# Patient Record
Sex: Male | Born: 1945 | Race: Black or African American | Hispanic: No | Marital: Married | Smoking: Former smoker
Health system: Southern US, Community
[De-identification: ages and names within clinical notes are randomized; demographics above are authoritative.]

## PROBLEM LIST (undated history)

## (undated) DIAGNOSIS — M1990 Unspecified osteoarthritis, unspecified site: Secondary | ICD-10-CM

## (undated) DIAGNOSIS — F40240 Claustrophobia: Secondary | ICD-10-CM

## (undated) DIAGNOSIS — K219 Gastro-esophageal reflux disease without esophagitis: Secondary | ICD-10-CM

## (undated) DIAGNOSIS — I10 Essential (primary) hypertension: Secondary | ICD-10-CM

## (undated) DIAGNOSIS — E119 Type 2 diabetes mellitus without complications: Secondary | ICD-10-CM

## (undated) DIAGNOSIS — C801 Malignant (primary) neoplasm, unspecified: Secondary | ICD-10-CM

## (undated) HISTORY — DX: Claustrophobia: F40.240

## (undated) HISTORY — PX: ROTATOR CUFF REPAIR: SHX139

## (undated) HISTORY — DX: Gastro-esophageal reflux disease without esophagitis: K21.9

## (undated) HISTORY — PX: COLONOSCOPY: SHX174

## (undated) HISTORY — PX: KNEE SURGERY: SHX244

## (undated) HISTORY — DX: Unspecified osteoarthritis, unspecified site: M19.90

## (undated) HISTORY — PX: POLYPECTOMY: SHX149

## (undated) HISTORY — PX: FINGER SURGERY: SHX640

---

## 1998-04-16 DIAGNOSIS — C189 Malignant neoplasm of colon, unspecified: Secondary | ICD-10-CM

## 1998-04-16 HISTORY — DX: Malignant neoplasm of colon, unspecified: C18.9

## 1999-02-10 ENCOUNTER — Encounter (INDEPENDENT_AMBULATORY_CARE_PROVIDER_SITE_OTHER): Payer: Self-pay | Admitting: *Deleted

## 1999-02-10 ENCOUNTER — Ambulatory Visit (HOSPITAL_COMMUNITY): Admission: RE | Admit: 1999-02-10 | Discharge: 1999-02-10 | Payer: Self-pay | Admitting: *Deleted

## 1999-02-15 HISTORY — PX: COLON SURGERY: SHX602

## 1999-02-22 ENCOUNTER — Ambulatory Visit (HOSPITAL_COMMUNITY): Admission: RE | Admit: 1999-02-22 | Discharge: 1999-02-22 | Payer: Self-pay | Admitting: General Surgery

## 1999-02-22 ENCOUNTER — Encounter (HOSPITAL_BASED_OUTPATIENT_CLINIC_OR_DEPARTMENT_OTHER): Payer: Self-pay | Admitting: General Surgery

## 1999-02-27 ENCOUNTER — Encounter (HOSPITAL_BASED_OUTPATIENT_CLINIC_OR_DEPARTMENT_OTHER): Payer: Self-pay | Admitting: General Surgery

## 1999-03-01 ENCOUNTER — Encounter (INDEPENDENT_AMBULATORY_CARE_PROVIDER_SITE_OTHER): Payer: Self-pay | Admitting: *Deleted

## 1999-03-01 ENCOUNTER — Inpatient Hospital Stay (HOSPITAL_COMMUNITY): Admission: RE | Admit: 1999-03-01 | Discharge: 1999-03-07 | Payer: Self-pay | Admitting: General Surgery

## 2001-02-27 ENCOUNTER — Encounter: Payer: Self-pay | Admitting: Emergency Medicine

## 2001-02-27 ENCOUNTER — Emergency Department (HOSPITAL_COMMUNITY): Admission: EM | Admit: 2001-02-27 | Discharge: 2001-02-27 | Payer: Self-pay | Admitting: Emergency Medicine

## 2001-02-27 ENCOUNTER — Encounter (INDEPENDENT_AMBULATORY_CARE_PROVIDER_SITE_OTHER): Payer: Self-pay | Admitting: *Deleted

## 2001-03-18 ENCOUNTER — Ambulatory Visit (HOSPITAL_BASED_OUTPATIENT_CLINIC_OR_DEPARTMENT_OTHER): Admission: RE | Admit: 2001-03-18 | Discharge: 2001-03-18 | Payer: Self-pay | Admitting: Urology

## 2003-05-04 ENCOUNTER — Ambulatory Visit (HOSPITAL_COMMUNITY): Admission: RE | Admit: 2003-05-04 | Discharge: 2003-05-04 | Payer: Self-pay | Admitting: Podiatry

## 2003-05-04 ENCOUNTER — Ambulatory Visit (HOSPITAL_BASED_OUTPATIENT_CLINIC_OR_DEPARTMENT_OTHER): Admission: RE | Admit: 2003-05-04 | Discharge: 2003-05-04 | Payer: Self-pay | Admitting: Podiatry

## 2004-04-24 ENCOUNTER — Ambulatory Visit (HOSPITAL_COMMUNITY): Admission: RE | Admit: 2004-04-24 | Discharge: 2004-04-24 | Payer: Self-pay | Admitting: Internal Medicine

## 2004-07-10 ENCOUNTER — Encounter: Admission: RE | Admit: 2004-07-10 | Discharge: 2004-07-10 | Payer: Self-pay | Admitting: Orthopedic Surgery

## 2006-12-31 ENCOUNTER — Emergency Department (HOSPITAL_COMMUNITY): Admission: EM | Admit: 2006-12-31 | Discharge: 2006-12-31 | Payer: Self-pay | Admitting: Emergency Medicine

## 2008-08-07 IMAGING — CR DG CHEST 2V
2 series · 2 of 2 positions shown · non-contrast
Comparison: none

CLINICAL DATA: Shortness of breath, left-sided chest numbness. 
 CHEST - 2 VIEW:

[w chest pa]
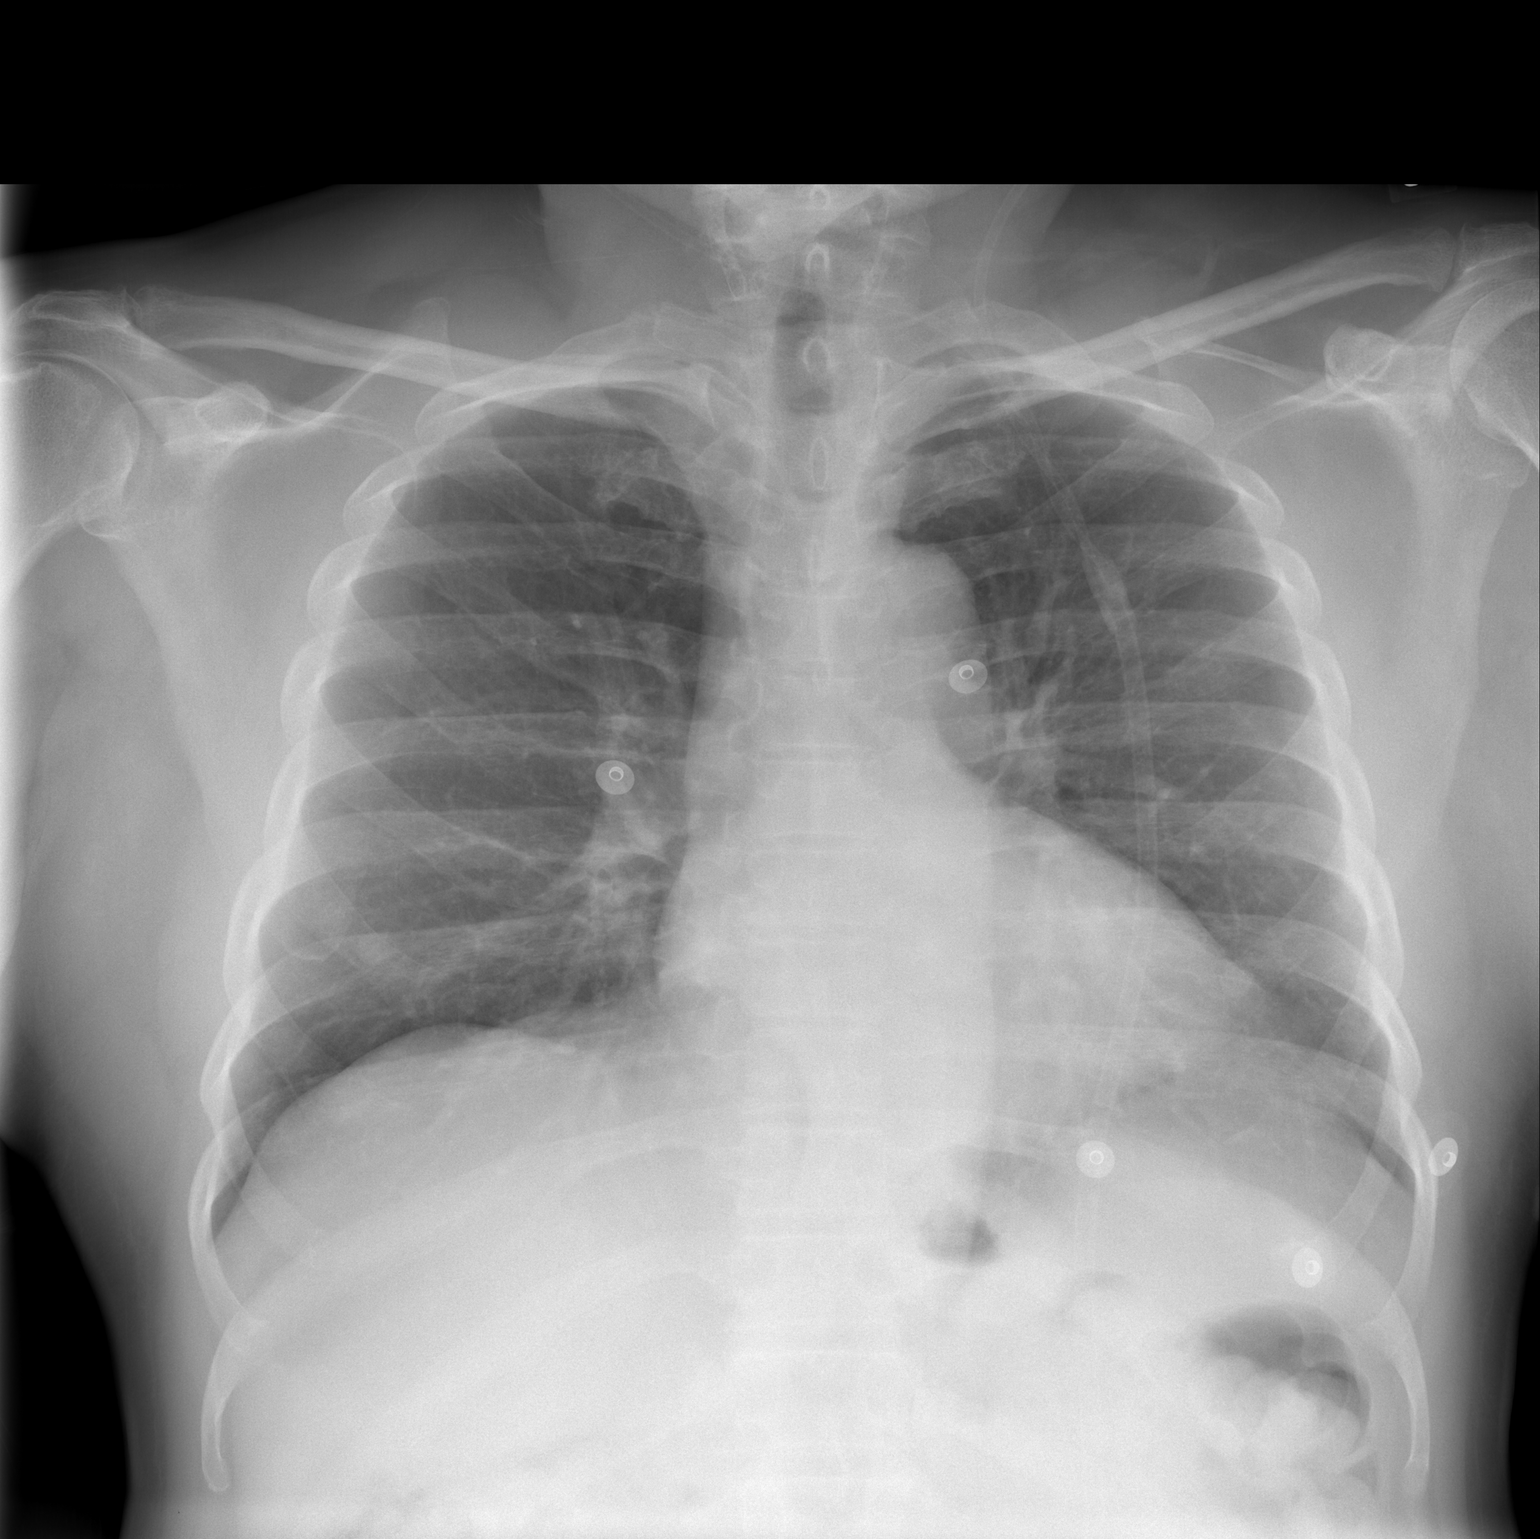

[w chest lat]
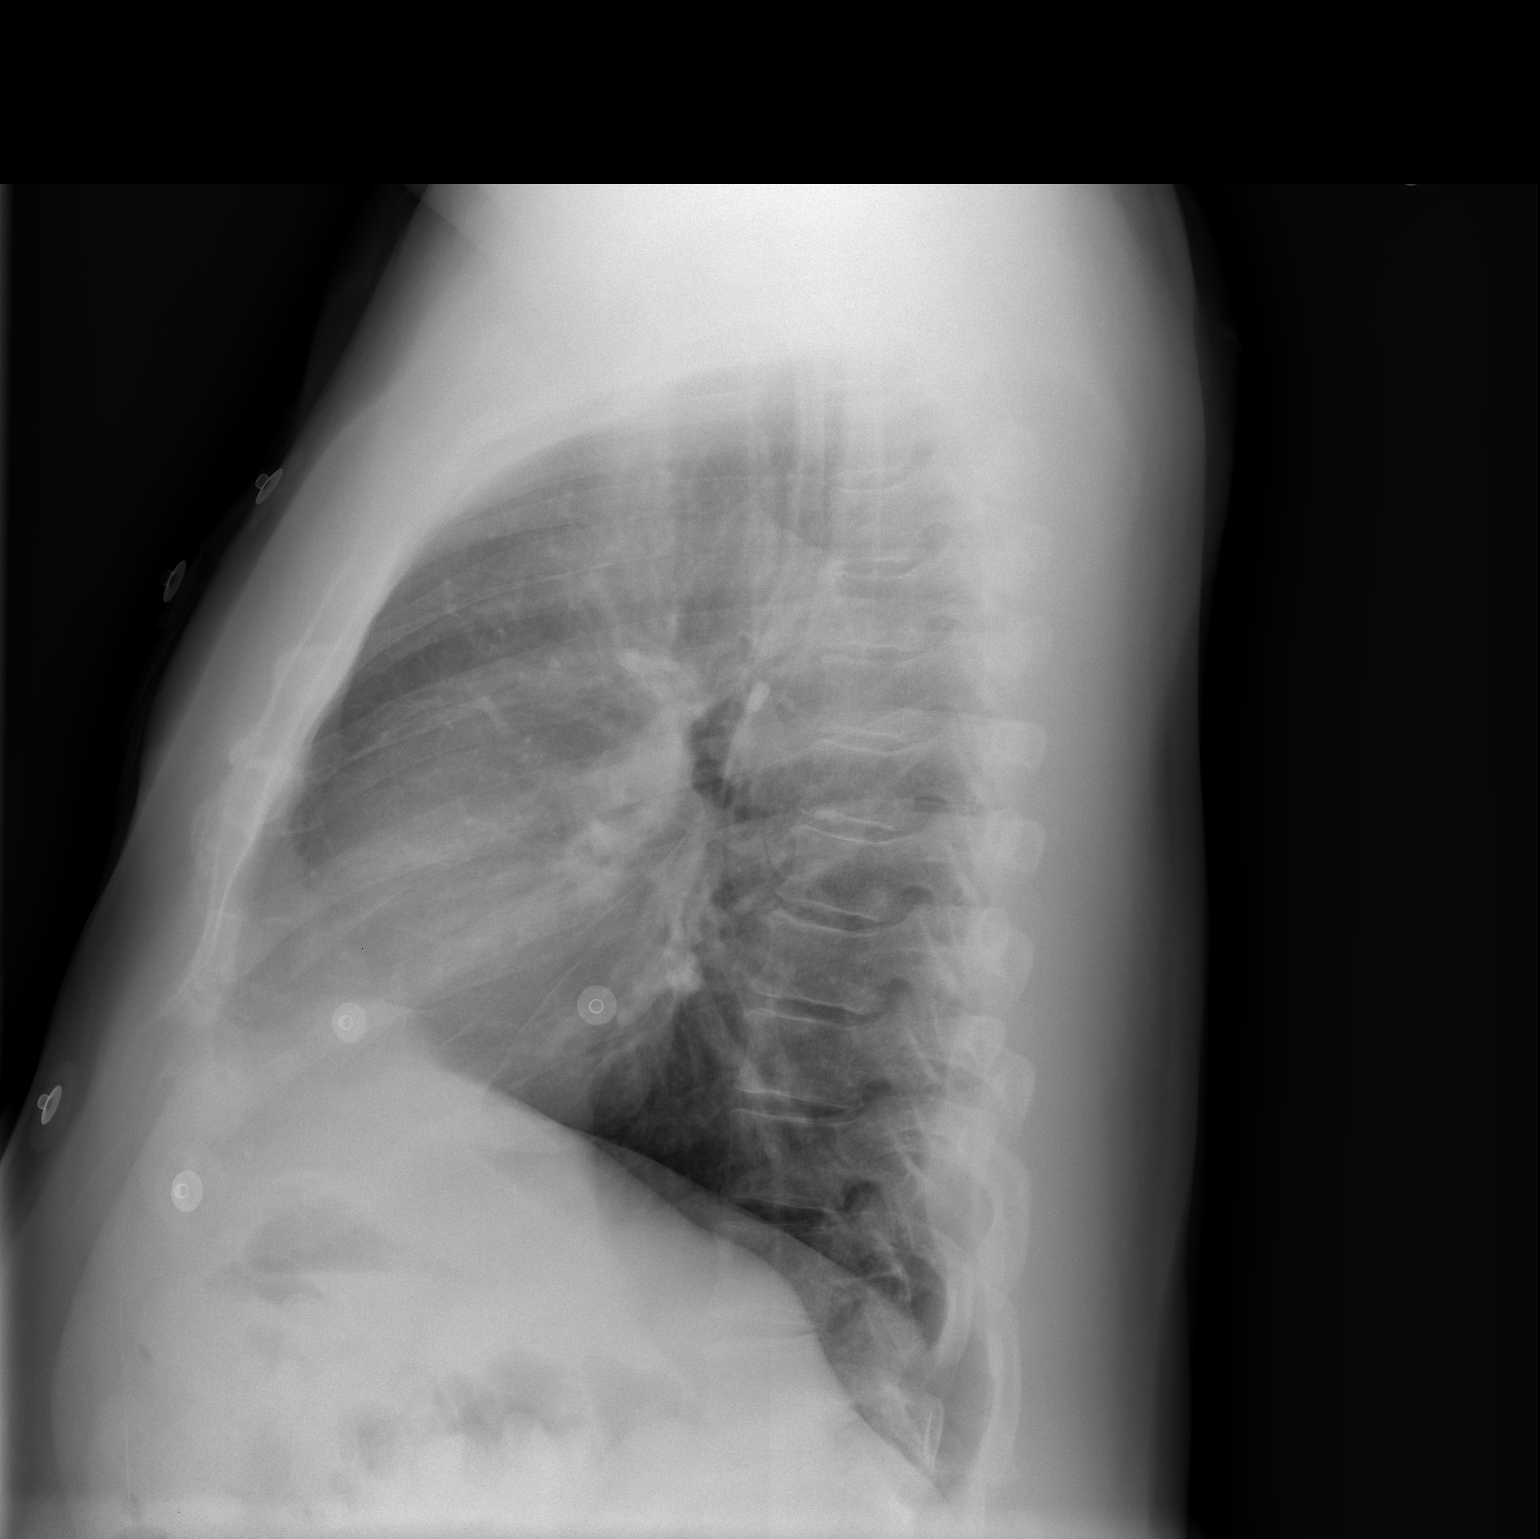

[2 of 2 positions shown; findings below may reference images not displayed]

FINDINGS: Two views of the chest show the lungs to be clear.  Symmetrical nipple shadows are noted at the lung bases.  The heart is within normal limits in size.
IMPRESSION: No active lung disease.

## 2009-10-03 ENCOUNTER — Encounter (INDEPENDENT_AMBULATORY_CARE_PROVIDER_SITE_OTHER): Payer: Self-pay | Admitting: *Deleted

## 2009-11-10 ENCOUNTER — Encounter (INDEPENDENT_AMBULATORY_CARE_PROVIDER_SITE_OTHER): Payer: Self-pay | Admitting: *Deleted

## 2009-11-14 ENCOUNTER — Ambulatory Visit: Payer: Self-pay | Admitting: Internal Medicine

## 2009-11-14 ENCOUNTER — Encounter (INDEPENDENT_AMBULATORY_CARE_PROVIDER_SITE_OTHER): Payer: Self-pay | Admitting: *Deleted

## 2009-11-14 DIAGNOSIS — R141 Gas pain: Secondary | ICD-10-CM | POA: Insufficient documentation

## 2009-11-14 DIAGNOSIS — R143 Flatulence: Secondary | ICD-10-CM | POA: Insufficient documentation

## 2009-11-14 DIAGNOSIS — Z85038 Personal history of other malignant neoplasm of large intestine: Secondary | ICD-10-CM | POA: Insufficient documentation

## 2009-11-14 DIAGNOSIS — R142 Eructation: Secondary | ICD-10-CM

## 2009-11-21 ENCOUNTER — Encounter (INDEPENDENT_AMBULATORY_CARE_PROVIDER_SITE_OTHER): Payer: Self-pay | Admitting: *Deleted

## 2009-11-23 ENCOUNTER — Ambulatory Visit: Payer: Self-pay | Admitting: Internal Medicine

## 2009-12-07 ENCOUNTER — Ambulatory Visit: Payer: Self-pay | Admitting: Internal Medicine

## 2010-05-16 NOTE — Miscellaneous (Signed)
Summary: [facility]  Clinical Lists Changes  Medications: Added new medication of MOVIPREP 100 GM  SOLR (PEG-KCL-NACL-NASULF-NA ASC-C) As per prep instructions. - Signed Rx of MOVIPREP 100 GM  SOLR (PEG-KCL-NACL-NASULF-NA ASC-C) As per prep instructions.;  #1 x 0;  Signed;  Entered by: Ezra Sites RN;  Authorized by: Hilarie Fredrickson MD;  Method used: Electronically to [facility]. #[id number]*, [address], [place], [place], [place]  [address], Ph: [phone], Fax: [phone] Observations: Added new observation of ALLERGY REV: Done (11/23/2009 7:58)    Prescriptions: MOVIPREP 100 GM  SOLR (PEG-KCL-NACL-NASULF-NA ASC-C) As per prep instructions.  #1 x 0   Entered by:   Ezra Sites RN   Authorized by:   Hilarie Fredrickson MD   Signed by:   Ezra Sites RN on 11/23/2009   Method used:   Electronically to        [facility]. [phone]* (retail)       [address]       [place], [place]  [address]       Ph: [phone]       Fax: [phone]   RxID:   [id number]

## 2010-05-16 NOTE — Letter (Signed)
Summary: Previsit letter  [facility]  [address] [place], [place] [address]   Phone: [phone]  Fax: [phone]       11/14/2009 MRN: [id number]  Lance Crosby [address] [place], [place]  [address]  Dear Lance Crosby,  Welcome to the Gastroenterology Division at [facility].    You are scheduled to see a nurse for your pre-procedure visit on 11/23/2009 at 8:00AM on the 3rd floor at [facility], [address].  We ask that you try to arrive at our office 15 minutes prior to your appointment time to allow for check-in.  Your nurse visit will consist of discussing your medical and surgical history, your immediate family medical history, and your medications.    Please bring a complete list of all your medications or, if you prefer, bring the medication bottles and we will list them.  We will need to be aware of both prescribed and over the counter drugs.  We will need to know exact dosage information as well.  If you are on blood thinners (Coumadin, Plavix, Aggrenox, Ticlid, etc.) please call our office today/prior to your appointment, as we need to consult with your physician about holding your medication.   Please be prepared to read and sign documents such as consent forms, a financial agreement, and acknowledgement forms.  If necessary, and with your consent, a friend or relative is welcome to sit-in on the nurse visit with you.  Please bring your insurance card so that we may make a copy of it.  If your insurance requires a referral to see a specialist, please bring your referral form from your primary care physician.  No co-pay is required for this nurse visit.     If you cannot keep your appointment, please call [phone] to cancel or reschedule prior to your appointment date.  This allows [place] the opportunity to schedule an appointment for another patient in need of care.    Thank you for choosing [facility] for your medical  needs.  We appreciate the opportunity to care for you.  Please visit [place] at our website  to learn more about our practice.                     Sincerely.                                                                                                                   The Gastroenterology Division

## 2010-05-16 NOTE — Op Note (Signed)
Summary: Operative Report                    [facility]  Patient:    Lance Crosby                      MRN: [id number] Proc. Date: 03/01/99 Adm. Date:  [id number] Attending:  Sonda Primes CC:         Mardene Celeste. Lurene Shadow, M.D. (2)                           Operative Report  PREOPERATIVE DIAGNOSIS:  Carcinoma of the sigmoid colon.  POSTOPERATIVE DIAGNOSIS:  Carcinoma of the sigmoid colon.  PROCEDURE:  Sigmoid colectomy with low colorectal anastomosis.  SURGEON:  Mardene Celeste. Lurene Shadow, M.D.  ASSISTANT:  Marnee Spring. Wiliam Ke, M.D.  ANESTHESIA:  General.  INDICATIONS:  This patient is a 65 year old man who underwent a recent colonoscopy and was noted to have what appeared to be an adenomatous polyp approximately 25.0 cm from the anal verge.  On evaluation pathologically, this was noted to have carcinoma within the poly, with invasion of the stalk at the cut margin.  The patient is brought to the operating room now for a sigmoid colectomy.  DESCRIPTION OF PROCEDURE:  Following the induction of anesthesia, with the patient positioned supinely, the abdomen was routinely prepped and draped, to be included in the sterile operative field.  A midline incision was made from the pubis to ust above the umbilicus.  This was deepened through the skin and the subcutaneous tissues, down through the midline.  Upon entering the abdomen, the small and large intestine were evaluated.  The liver showed no evidence of metastasis, and the periaortic lymph nodes appeared to be normal.  There is no grossly metastatic disease within the mesentery.  The specific area of the sigmoid colon where the  biopsy was done was not clear, however.  We proceeded to do a resection of the entire sigmoid from the distal descending colon down to and up and across the end of the promontory.  This was mobilized by dicing along the retroperitoneal reflection and identifying the ureter and the  iliac vessels, and carrying the dissection over the pelvic brim, down into the true pelvis.  This was done on both sides.  The distal descending colon was then transected with the GIA stapler with the distal tip of an EEA stapler contained within the colon.  The distal end of the colon was transected at approximately the upper third of the rectum.  The proximal colon was then mobilized down into the pelvis.  I did not have to take down the  splenic flexure in order to do this.  The end of the EEA device was brought through the proximal colon, and the EEA device then placed through the rectum and brought out into the wound.  An end-to-end stapled anastomosis was carried out without difficulty, and there was no tension on the anastomosis.  Two complete rings of  distal and proximal colon were retrieved.  A proctosigmoidoscopy while on the table showed no evidence of an air leak, and the colon distended well.  The wound was  then thoroughly irrigated with normal saline.  The retroperitoneum was closed with interrupted #2-0 Vicryl sutures.  The sponge, instrument, and sharp counts were  then verified.  The abdominal wound closed in layers using a running #1 Novofil to reclose  the midline.  The subcutaneous tissues were irrigated and the skin was closed with staples.  Sterile dressings were applied.  The anesthetic was reversed.  The patient was removed from the operating room to the recovery room in stable condition, having tolerated the procedure well. DD:  03/01/99 TD:  03/02/99 Job: [id number]

## 2010-05-16 NOTE — Procedures (Signed)
Summary: Colonoscopy  Patient: Lance Crosby Note: All result statuses are Final unless otherwise noted.  Tests: (1) Colonoscopy (COL)   COL Colonoscopy           DONE     [facility]     [address]     [place], [place]  [address]           COLONOSCOPY PROCEDURE REPORT           PATIENT:  [name], [name]  MR#:  [id number]     BIRTHDATE:  November 12, 1945, 63 yrs. old  GENDER:  male     ENDOSCOPIST:  Wilhemina Bonito. Eda Keys, MD     REF. BY:  Knox Royalty, M.D.     PROCEDURE DATE:  12/07/2009     PROCEDURE:  Surveillance Colonoscopy     ASA CLASS:  Class I     INDICATIONS:  history of colon cancer, surveillance and high-risk     screening ; Sigmoid resecetion post removal of malignant colon     polyp 2000; reports one negative f/u thereafter, but none in 5+     yrs     MEDICATIONS:   Fentanyl 75 mcg IV, Versed 7 mg IV           DESCRIPTION OF PROCEDURE:   After the risks benefits and     alternatives of the procedure were thoroughly explained, informed     consent was obtained.  Digital rectal exam was performed and     revealed no abnormalities.   The LB CF-[id number] endoscope     was introduced through the anus and advanced to the cecum, which     was identified by both the appendix and ileocecal valve, without     limitations.Time to cecum = 3:16 min.  The quality of the prep was     good, using MoviPrep.  The instrument was then slowly withdrawn     (time = 13:59 min) as the colon was fully examined.     <<PROCEDUREIMAGES>>           FINDINGS:  There was evidence of a prior segmental colectomy in     the sigmoid colon.  Otherwise, a normal appearing cecum, ileocecal     valve, and appendiceal orifice were identified. The ascending,     hepatic flexure, transverse, splenic flexure, descending, sigmoid     colon, and rectum appeared unremarkable.   Retroflexed views in     the rectum revealed internal hemorrhoids.  No polyps or cancers.     The scope was then withdrawn  from the patient and the procedure     completed.           COMPLICATIONS:  None     ENDOSCOPIC IMPRESSION:     1) Prior segmental colectomy in the sigmoid colon     2) Normal colonoscopy otherwise     3) Internal hemorrhoids           RECOMMENDATIONS:     1) Follow up colonoscopy in 5 years           ______________________________     Wilhemina Bonito. Eda Keys, MD           CC:  Knox Royalty, MD; The Patient           n.     eSIGNED:   Wilhemina Bonito. Eda Keys at 12/07/2009 04:02 PM           Rush Farmer, [phone]  Note: An exclamation mark (!) indicates a result that was not dispersed into the flowsheet. Document Creation Date: 12/07/2009 4:04 PM _______________________________________________________________________  (1) Order result status: Final Collection or observation date-time: 12/07/2009 15:52 Requested date-time:  Receipt date-time:  Reported date-time:  Referring Physician:   Ordering Physician: Fransico Setters [phone]) Specimen Source:  Source: Launa Grill Order Number: [id number] Lab site:   Appended Document: Colonoscopy    Clinical Lists Changes  Observations: Added new observation of COLONNXTDUE: 11/2014 (12/07/2009 4:05)

## 2010-05-16 NOTE — Letter (Signed)
Summary: New Patient letter  [facility]  [address] [place], [place] [address]   Phone: [phone]  Fax: [phone]       10/03/2009 MRN: [id number]  Lance Crosby [address] [place], [place]  [address]  Dear Lance Crosby,  Welcome to the Gastroenterology Division at [facility].    You are scheduled to see Dr.  Marina Goodell on 11-14-09 at 9:30am on the 3rd floor at [facility], [address].  We ask that you try to arrive at our office 15 minutes prior to your appointment time to allow for check-in.  We would like you to complete the enclosed self-administered evaluation form prior to your visit and bring it with you on the day of your appointment.  We will review it with you.  Also, please bring a complete list of all your medications or, if you prefer, bring the medication bottles and we will list them.  Please bring your insurance card so that we may make a copy of it.  If your insurance requires a referral to see a specialist, please bring your referral form from your primary care physician.  Co-payments are due at the time of your visit and may be paid by cash, check or credit card.     Your office visit will consist of a consult with your physician (includes a physical exam), any laboratory testing he/she may order, scheduling of any necessary diagnostic testing (e.g. x-ray, ultrasound, CT-scan), and scheduling of a procedure (e.g. Endoscopy, Colonoscopy) if required.  Please allow enough time on your schedule to allow for any/all of these possibilities.    If you cannot keep your appointment, please call [phone] to cancel or reschedule prior to your appointment date.  This allows [place] the opportunity to schedule an appointment for another patient in need of care.  If you do not cancel or reschedule by 5 p.m. the business day prior to your appointment date, you will be charged a $50.00 late cancellation/no-show fee.    Thank you for choosing  [facility] for your medical needs.  We appreciate the opportunity to care for you.  Please visit [place] at our website  to learn more about our practice.                     Sincerely,                                                             The Gastroenterology Division

## 2010-05-16 NOTE — Assessment & Plan Note (Signed)
Summary: SCREEN FOR COLON/YF   History of Present Illness Visit Type: consult  Primary GI MD: Yancey Flemings MD Primary Provider: Paulino Rily, MD Requesting Provider: Paulino Rily, MD  Chief Complaint: Consult colon. Pt c/o bloating  History of Present Illness:   65 year old with a history of hypertension, arthritis, and colon cancer status post sigmoid resection in November of 2000. Patient was found to have a sigmoid colon polyp with invasive adenocarcinoma. Subsequent sigmoid resection without residual cancer. He had apparently underwent one followup colonoscopy. He has had no colonoscopy in greater than 5 years he realizes that he is overdue. His only complaint is that of bloating and abdominal distention. GI review of systems is otherwise negative.. Prior operative report and pathology review. Recent evaluation with Dr. Elnoria Howard. Come in to see [place] through to insurance issues.   GI Review of Systems    Reports bloating.      Denies abdominal pain, acid reflux, belching, chest pain, dysphagia with liquids, dysphagia with solids, heartburn, loss of appetite, nausea, vomiting, vomiting blood, weight loss, and  weight gain.        Denies anal fissure, black tarry stools, change in bowel habit, constipation, diarrhea, diverticulosis, fecal incontinence, heme positive stool, hemorrhoids, irritable bowel syndrome, jaundice, light color stool, liver problems, rectal bleeding, and  rectal pain.    Current Medications (verified): 1)  Exforge 10-320 Mg Tabs (Amlodipine Besylate-Valsartan) .... One Tablet By Mouth Once Daily  Allergies (verified): 1)  ! Steroids  Past History:  Past Medical History: Reviewed history from 11/10/2009 and no changes required. Arthritis Hypertension Colon Cancer Tubullovillous Adenoma  Past Surgical History: Sigmoid Colectomy Knee Surgery Lt. and Rt.  Family History: Alzheimer's Disease- Mother Cerebral Hemorrhage- Father No FH of Colon Cancer: Family  History of Diabetes:  Family History of Kidney Disease:  Social History: Married Pharmacist, community Illicit Drug Use - no Alcohol Use - no Patient is a former smoker.  Daily Caffeine Use: four daily  Smoking Status:  quit  Review of Systems       The patient complains of arthritis/joint pain.  The patient denies allergy/sinus, anemia, anxiety-new, back pain, blood in urine, breast changes/lumps, change in vision, confusion, cough, coughing up blood, depression-new, fainting, fatigue, fever, headaches-new, hearing problems, heart murmur, heart rhythm changes, itching, muscle pains/cramps, night sweats, nosebleeds, shortness of breath, skin rash, sleeping problems, sore throat, swelling of feet/legs, swollen lymph glands, thirst - excessive, urination - excessive, urination changes/pain, urine leakage, vision changes, and voice change.    Vital Signs:  Patient profile:   65 year old male Height:      69 inches Weight:      207 pounds BMI:     30.68 BSA:     2.10 Pulse rate:   76 / minute Pulse rhythm:   regular BP sitting:   132 / 84  (left arm) Cuff size:   regular  Vitals Entered By: Ok Anis CMA (November 14, 2009 9:30 AM)  Physical Exam  General:  Well developed, well nourished, no acute distress. Head:  Normocephalic and atraumatic. Eyes:  PERRLA, no icterus. Ears:  Normal auditory acuity. Nose:  No deformity, discharge,  or lesions. Mouth:  No deformity or lesions, dentition normal. Neck:  Supple; no masses or thyromegaly. Lungs:  Clear throughout to auscultation. Heart:  Regular rate and rhythm; no murmurs, rubs,  or bruits. Abdomen:  Soft, nontender and nondistended. No masses, hepatosplenomegaly or hernias noted. Normal bowel sounds.. There is diastases in  the midline but no true hernia Rectal:  deferred until colonoscopy Msk:  Symmetrical with no gross deformities. Normal posture. Pulses:  Normal pulses noted. Extremities:  No  edema or deformities  noted. Neurologic:  alert and oriented Skin:  Intact without significant lesions or rashes. Psych:  Alert and cooperative. Normal mood and affect.   Impression & Recommendations:  Problem # 1:  FLATULENCE-GAS-BLOATING (ICD-787.3) nonspecific bloating, truncal obesity, and diastases year prior abdominal surgery without true hernia.  Plan  #1 Anti-gas and flatulence dietary sheet #2 weight loss  Problem # 2:  PERSONAL HX COLON CANCER (ICD-V10.05) sigmoid colon polyp with invasive cancer status post polypectomy followed by a sigmoid colectomy without residual cancer. No follow up colonoscopy in at least 5 years. He is an appropriate candidate for followup at this time without contraindication. The nature of colonoscopy as well as the risks, benefits, and alternatives were reviewed in detail. He understood and agreed to proceed.. The Movi prep to be prescribed. The patient was instructed on use  Patient Instructions: 1)  Please call to schedule Colonoscopy when you have your work schedule available.   2)  The medication list was reviewed and reconciled.  All changed / newly prescribed medications were explained.  A complete medication list was provided to the patient / caregiver. 3)  Copy: Dr. Knox Royalty

## 2010-05-16 NOTE — Letter (Signed)
Summary: [facility] Instructions  [facility]  [address] [place], [place] [address]   Phone: [phone]  Fax: [phone]       Lance Crosby    [birth year]    MRN: [id number]        Procedure Day Dorna Bloom: Wednesday 12-07-09     Arrival Time: 2:30 pm     Procedure Time: 3:30 pm     Location of Procedure:                    _x_  [facility] (4th Floor)   PREPARATION FOR COLONOSCOPY WITH MOVIPREP   Starting 5 days prior to your procedure  12-02-09 do not eat nuts, seeds, popcorn, corn, beans, peas,  salads, or any raw vegetables.  Do not take any fiber supplements (e.g. Metamucil, Citrucel, and Benefiber).  THE DAY BEFORE YOUR PROCEDURE         DATE:  12-06-09  DAY: Tuesday  1.  Drink clear liquids the entire day-NO SOLID FOOD  2.  Do not drink anything colored red or purple.  Avoid juices with pulp.  No orange juice.  3.  Drink at least 64 oz. (8 glasses) of fluid/clear liquids during the day to prevent dehydration and help the prep work efficiently.  CLEAR LIQUIDS INCLUDE: Water Jello Ice Popsicles Tea (sugar ok, no milk/cream) Powdered fruit flavored drinks Coffee (sugar ok, no milk/cream) Gatorade Juice: apple, white grape, white cranberry  Lemonade Clear bullion, consomm, broth Carbonated beverages (any kind) Strained chicken noodle soup Hard Candy                             4.  In the morning, mix first dose of MoviPrep solution:    Empty 1 Pouch A and 1 Pouch B into the disposable container    Add lukewarm drinking water to the top line of the container. Mix to dissolve    Refrigerate (mixed solution should be used within 24 hrs)  5.  Begin drinking the prep at 5:00 p.m. The MoviPrep container is divided by 4 marks.   Every 15 minutes drink the solution down to the next mark (approximately 8 oz) until the full liter is complete.   6.  Follow completed prep with 16 oz of clear liquid of your choice (Nothing red or purple).   Continue to drink clear liquids until bedtime.  7.  Before going to bed, mix second dose of MoviPrep solution:    Empty 1 Pouch A and 1 Pouch B into the disposable container    Add lukewarm drinking water to the top line of the container. Mix to dissolve    Refrigerate  THE DAY OF YOUR PROCEDURE      DATE:  12-07-09 DAY: Wednesday  Beginning at  10:30 a.m. (5 hours before procedure):         1. Every 15 minutes, drink the solution down to the next mark (approx 8 oz) until the full liter is complete.  2. Follow completed prep with 16 oz. of clear liquid of your choice.    3. You may drink clear liquids until  1:30 p.m.  (2 HOURS BEFORE PROCEDURE).   MEDICATION INSTRUCTIONS  Unless otherwise instructed, you should take regular prescription medications with a small sip of water   as early as possible the morning of your procedure.          OTHER INSTRUCTIONS  You will  need a responsible adult at least 65 years of age to accompany you and drive you home.   This person must remain in the waiting room during your procedure.  Wear loose fitting clothing that is easily removed.  Leave jewelry and other valuables at home.  However, you may wish to bring a book to read or  an iPod/MP3 player to listen to music as you wait for your procedure to start.  Remove all body piercing jewelry and leave at home.  Total time from sign-in until discharge is approximately 2-3 hours.  You should go home directly after your procedure and rest.  You can resume normal activities the  day after your procedure.  The day of your procedure you should not:   Drive   Make legal decisions   Operate machinery   Drink alcohol   Return to work  You will receive specific instructions about eating, activities and medications before you leave.    The above instructions have been reviewed and explained to me by   Ezra Sites RN  November 23, 2009 8:20 AM    I fully understand and can  verbalize these instructions _____________________________ Date _________

## 2010-09-01 NOTE — Op Note (Signed)
[facility]  Patient:    VIRGILIO, BROADHEAD Visit Number: [id number] MRN: [id number]          Service Type: NES Location: NESC Attending Physician:  Lindaann Slough Proc. Date: 03/18/01 Admit Date:  03/18/2001   CC:         Lindell Spar. Chestine Spore, M.D.                           Operative Report  PREOPERATIVE DIAGNOSIS:  Bilateral hydrocele.  POSTOPERATIVE DIAGNOSIS:  Bilateral hydrocele.  PROCEDURE PERFORMED:  Bilateral hydrocelectomy  SURGEON:  Lindaann Slough, M.D.  ANESTHESIA:  General.  INDICATION:  The patient is a 65 year old male who was complaining of pain and swelling of his right scrotum two weeks ago.  He had swelling of the scrotum for several months and the swelling had increased in size lately.  He was found on physical examination to have bilateral hydrocele.  He is admitted today for hydrocelectomy.  DESCRIPTION OF PROCEDURE:  Under general anesthesia, the patient was prepped and draped, and placed in the supine position.  A longitudinal incision was made on the right scrotum.  The incision was carried down to the tunica vaginalis, which was then incised.  About 200 cc of clear fluid were drained out of the hydrocele sac.  Hemostasis was secured with electrocautery.  The tunica vaginalis was imbricated with #3-0 chromic using the Lords technique. The wound was then irrigated with normal saline and the testicle was replaced into the scrotum.  There was no evidence of bleeding.  The incision was then closed in two layers with #3-0 Vicryl.  An incision was made on the left scrotum.  The incision was also carried down to the tunica vaginalis, which was also incised.  About 75 cc of fluid were drained out of the hydrocele sac, then the tunica vaginalis was imbricated using the Lords s technique with #3-0 chromic.  Hemostasis was secured with electrocautery.  There was no evidence of hemorrhage at the end of the procedure.  The wound was  then closed in two layers with #3-0 Vicryl.  The patient tolerated the procedure well and left the operating room in satisfactory condition to the post anesthesia care unit. Attending Physician:  Lindaann Slough DD:  03/18/01 TD:  03/18/01 Job: [id number]

## 2010-09-01 NOTE — Op Note (Signed)
NAME:  Lance Crosby, Lance Crosby                        ACCOUNT NO.:  [id number]   MEDICAL RECORD NO.:  [id number]                   PATIENT TYPE:  AMB   LOCATION:  DSC                                  FACILITY:  [facility]   PHYSICIAN:  Ezequiel Kayser. Ajlouny, D.P.M.           DATE OF BIRTH:  November 26, 1945   DATE OF PROCEDURE:  05/04/2003  DATE OF DISCHARGE:  05/04/2003                                 OPERATIVE REPORT   SURGEON:  Ezequiel Kayser. Ajlouny, D.P.M.   ASSISTANT:  None.   PREOPERATIVE DIAGNOSIS:  Hallux rigidus, right foot.   POSTOPERATIVE DIAGNOSIS:  Hallux rigidus, right foot.   PROCEDURE:  Cheilectomy with first MTP joint implant, right foot.   ANESTHESIA:  MAC with local.   COMPLICATIONS:  None.   HEMOSTASIS:  Pneumatic ankle tourniquet inflated to 250 mmHg.   ESTIMATED BLOOD LOSS:  Minimal.   DESCRIPTION OF PROCEDURE:  The patient was brought to the OR and placed in  the supine position at which time monitored anesthesia care was  administered.  A local block was performed with a 1 to 1 mixture of 0.5%  Marcaine plain and 1% lidocaine plain.  A well padded pneumatic ankle  tourniquet was applied superior to the medial malleolus.  The patient was  prepped and draped in the usual aseptic manner.  The foot was exsanguinated  with an Esmarch bandage and the previously applied tourniquet inflated to  250 mmHg.   Attention was directed to the first ray where a dorsal linear incision was  made.  The incision was deepened via sharp and blunt modalities, taking care  to clamp and cauterize all bleeding vessels and insuring retraction of all  neurovascular structures encountered.  The deep and superficial fascia was  separated medially and laterally the length of the incision.  Meticulous  care was taken to avoid the inadvertent release of the insertion of the  flexure hallucis brevis and the adductor and abductor tendons of the great  toe.  The articular surface on the phalanx was resected  utilizing a sagittal  saw removing only sufficient bone to avoid excessive joint tension or  prosthetic overlapping.  The cut was made parallel to the plane concavity of  the phalangeal articular surface.  The medial eminence was resected off the  head of the first metatarsal, the lateral aspect of the first metatarsal,  and all osteophytes dorsally.  The head of the first metatarsal was  remodeled using a sagittal saw, power rasp, rotary bur, and hand rasp.  Care  was insured that the metatarsal head was round so that there were no  restrictions with first MTP joint range of motion.  Using the sizer guide  implant, size was determined to be large.  The hole in the guide was used as  a p punch guide for the pin of the trial prosthesis.  A small bur was  used  to create the canal and  the sizer was placed.  The first MTP joint was put  through its range of motion, dorsiflexion and plantar flexion were greatly  improved.  There were no restrictions and range of motion was smooth.  Once  the appropriate size was determined, the properly sized implant was inserted  and completely seated into the proximal phalanx base.  The joint was reduced  and examined for tension and motion.  The hallux dorsiflexed approximately  30 degrees.  The range of motion was concentric and unimpinged.  Interoperative radiographs were taken to insure placement of the implant.  The surgical wound was then copiously irrigated with sterile saline and  antibiotic solution.  Deep closure of the capsule and periosteum was  performed using 3-0 Vicryl and subcutaneous closure was accomplished using 4-  0 Vicryl.  During resection of the osteophytes, a joint mouse was located at  the dorsal lateral aspect of the first MTP joint which was excised in total.  The capsule was pink and there was inflammatory tissue which was resected.  Skin closure was accomplished using 4-0 nylon in a running cutaneous  fashion.  The foot was then  dressed with Xeroform, 4 by 4s, Kling, and  Coban.  The tourniquet was deflated and vascular status returned to all  digits.  The patient was then sent to the recovery room with vital signs  stable and capillary refill time presurgical levels.  Both written and oral  postoperative instructions were given to the patient.  A postoperative shoe  dispensed.  All questions answered.  No guarantees given.                                               Ezequiel Kayser. Harriet Pho, D.P.M.    MJA/MEDQ  D:  05/06/2003  T:  05/06/2003  Job:  [id number]

## 2010-09-01 NOTE — Op Note (Signed)
[place]  Patient:    JONTEZ REDFIELD                      MRN: [id number] Proc. Date: 03/01/99 Adm. Date:  [id number] Attending:  Sonda Primes CC:         Mardene Celeste. Lurene Shadow, M.D. (2)                           Operative Report  PREOPERATIVE DIAGNOSIS:  Carcinoma of the sigmoid colon.  POSTOPERATIVE DIAGNOSIS:  Carcinoma of the sigmoid colon.  PROCEDURE:  Sigmoid colectomy with low colorectal anastomosis.  SURGEON:  Mardene Celeste. Lurene Shadow, M.D.  ASSISTANT:  Marnee Spring. Wiliam Ke, M.D.  ANESTHESIA:  General.  INDICATIONS:  This patient is a 65 year old man who underwent a recent colonoscopy and was noted to have what appeared to be an adenomatous polyp approximately 25.0 cm from the anal verge.  On evaluation pathologically, this was noted to have carcinoma within the poly, with invasion of the stalk at the cut margin.  The patient is brought to the operating room now for a sigmoid colectomy.  DESCRIPTION OF PROCEDURE:  Following the induction of anesthesia, with the patient positioned supinely, the abdomen was routinely prepped and draped, to be included in the sterile operative field.  A midline incision was made from the pubis to ust above the umbilicus.  This was deepened through the skin and the subcutaneous tissues, down through the midline.  Upon entering the abdomen, the small and large intestine were evaluated.  The liver showed no evidence of metastasis, and the periaortic lymph nodes appeared to be normal.  There is no grossly metastatic disease within the mesentery.  The specific area of the sigmoid colon where the  biopsy was done was not clear, however.  We proceeded to do a resection of the entire sigmoid from the distal descending colon down to and up and across the end of the promontory.  This was mobilized by dicing along the retroperitoneal reflection and identifying the ureter and the iliac vessels, and carrying  the dissection over the pelvic brim, down into the true pelvis.  This was done on both sides.  The distal descending colon was then transected with the GIA stapler with the distal tip of an EEA stapler contained within the colon.  The distal end of the colon was transected at approximately the upper third of the rectum.  The proximal colon was then mobilized down into the pelvis.  I did not have to take down the  splenic flexure in order to do this.  The end of the EEA device was brought through the proximal colon, and the EEA device then placed through the rectum and brought out into the wound.  An end-to-end stapled anastomosis was carried out without difficulty, and there was no tension on the anastomosis.  Two complete rings of  distal and proximal colon were retrieved.  A proctosigmoidoscopy while on the table showed no evidence of an air leak, and the colon distended well.  The wound was  then thoroughly irrigated with normal saline.  The retroperitoneum was closed with interrupted #2-0 Vicryl sutures.  The sponge, instrument, and sharp counts were  then verified.  The abdominal wound closed in layers using a running #1 Novofil to reclose the midline.  The subcutaneous tissues were irrigated and the skin was closed with staples.  Sterile dressings were applied.  The  anesthetic was reversed.  The patient was removed from the operating room to the recovery room in stable condition, having tolerated the procedure well. DD:  03/01/99 TD:  03/02/99 Job: [id number]

## 2011-01-25 LAB — CBC
HCT: 43.7
Hemoglobin: 14.8
MCHC: 34
MCV: 87.3
Platelets: 269
RBC: 5
RDW: 14.7 — ABNORMAL HIGH
WBC: 6.4

## 2011-01-25 LAB — D-DIMER, QUANTITATIVE: D-Dimer, Quant: 0.26

## 2011-01-25 LAB — BASIC METABOLIC PANEL
BUN: 7
CO2: 24
Calcium: 9.3
Chloride: 103
Creatinine, Ser: 0.78
GFR calc Af Amer: >60
GFR calc non Af Amer: >60
Glucose, Bld: 110 — ABNORMAL HIGH
Potassium: 3.6
Sodium: 137

## 2011-01-25 LAB — DIFFERENTIAL
Basophils Absolute: 0
Basophils Relative: 1
Eosinophils Absolute: 0.1
Eosinophils Relative: 2
Lymphocytes Relative: 33
Lymphs Abs: 2.1
Monocytes Absolute: 0.8 — ABNORMAL HIGH
Monocytes Relative: 12 — ABNORMAL HIGH
Neutro Abs: 3.4
Neutrophils Relative %: 53

## 2011-01-25 LAB — POCT CARDIAC MARKERS
CKMB, poc: 2
Myoglobin, poc: 57.2
Operator id: 4661
Troponin i, poc: <0.05

## 2012-01-01 ENCOUNTER — Ambulatory Visit: Payer: Self-pay | Admitting: *Deleted

## 2012-04-28 ENCOUNTER — Ambulatory Visit
Admission: RE | Admit: 2012-04-28 | Discharge: 2012-04-28 | Disposition: A | Discharge status: Home / Self Care | Payer: Medicare Other | Source: Ambulatory Visit | Attending: Nurse Practitioner | Admitting: Nurse Practitioner

## 2012-04-28 ENCOUNTER — Other Ambulatory Visit: Payer: Self-pay | Admitting: Nurse Practitioner

## 2012-04-28 DIAGNOSIS — R1900 Intra-abdominal and pelvic swelling, mass and lump, unspecified site: Secondary | ICD-10-CM

## 2012-07-09 ENCOUNTER — Emergency Department (HOSPITAL_COMMUNITY)
Admission: EM | Admit: 2012-07-09 | Discharge: 2012-07-09 | Disposition: A | Discharge status: Home / Self Care | Payer: Medicare Other | Attending: Emergency Medicine | Admitting: Emergency Medicine

## 2012-07-09 ENCOUNTER — Encounter (HOSPITAL_COMMUNITY): Payer: Self-pay | Admitting: Emergency Medicine

## 2012-07-09 DIAGNOSIS — I10 Essential (primary) hypertension: Secondary | ICD-10-CM | POA: Insufficient documentation

## 2012-07-09 DIAGNOSIS — E119 Type 2 diabetes mellitus without complications: Secondary | ICD-10-CM | POA: Insufficient documentation

## 2012-07-09 DIAGNOSIS — M25519 Pain in unspecified shoulder: Secondary | ICD-10-CM | POA: Insufficient documentation

## 2012-07-09 DIAGNOSIS — M25511 Pain in right shoulder: Secondary | ICD-10-CM

## 2012-07-09 DIAGNOSIS — Z87828 Personal history of other (healed) physical injury and trauma: Secondary | ICD-10-CM | POA: Insufficient documentation

## 2012-07-09 DIAGNOSIS — Z79899 Other long term (current) drug therapy: Secondary | ICD-10-CM | POA: Insufficient documentation

## 2012-07-09 HISTORY — DX: Type 2 diabetes mellitus without complications: E11.9

## 2012-07-09 HISTORY — DX: Essential (primary) hypertension: I10

## 2012-07-09 MED ORDER — OXYCODONE-ACETAMINOPHEN 5-325 MG PO TABS
1.0000 | ORAL_TABLET | Freq: Once | ORAL | Status: AC
  Administered 2012-07-09: 1 via ORAL
  Filled 2012-07-09: qty 1

## 2012-07-09 MED ORDER — HYDROCODONE-ACETAMINOPHEN 5-325 MG PO TABS: 1.0000 | ORAL_TABLET | ORAL | Status: DC | PRN

## 2012-07-09 MED ORDER — MIRTAZAPINE 7.5 MG PO TABS: 7.5000 mg | ORAL_TABLET | Freq: Every day | ORAL | Status: DC

## 2012-07-09 MED ORDER — ESCITALOPRAM OXALATE 10 MG PO TABS: 10.0000 mg | ORAL_TABLET | Freq: Every day | ORAL | Status: DC

## 2012-07-09 NOTE — ED Notes (Signed)
Pt states he fell down some steps 3 weeks ago and still has pain to his R shoulder. Pt states he went to [facility] for same and they did x-rays and told him he needed to follow up with PT. Pt states he has been unable to do this. Pt states his pain to R shoulder persists and is worse when he lifts his arm and worse at night when going to bed. Pt states it feels like "bone on bone." Pt has taken Ultram for pain, but states "it doesn't do any good.". Pt denies pain elsewhere. Rates pain 10/10. Pt states he drove himself here.

## 2012-07-09 NOTE — Consult Note (Deleted)
Reason for Consult: dementia, depression and auditory hallucinations Referring Physician: Dr.Nanavati  Lance Crosby is an 67 y.o. male.  HPI: Patient was seen and chart reviewed. Patient has no previous history of mental illness or treatment. Patient has been living by himself with the help of his family members closely monitoring him and helping him with his basic needs. Reportedly his wife of 63 years passed away with the renal failure and also has dementia in November 2013. Patient has been suffering with the loss of his wife, depressed, anxious, worried, isolated, less socialization, unable to go outside for shopping and has been going downhill. Patient stated she used to seeing in the want to seeing back again. He is worried about ringing noises in his years and being forget fullness and lack of stability on his feet weakness bothering him a lot. Patient is skate to go back to his home because of lack of balance and possibly falling down. Reportedly he fell down on steps 3 weeks ago has a right shoulder pain. Reportedly, he likes watching TV staying in his bed, and going out with his grandchildren.  MSE: Patient appeared lying down on his bed, with a depressed mood and flat affect, unshaven beard. He has a decreased psychomotor activity. He is slow in his verbal responses and reportedly scared for his life. He has ringing noises in his head and could not make it out. He denied visual hallucinations, paranoia. Patient has fair insight, judgment and impulse control.   Past Medical History  Diagnosis Date  . Diabetes mellitus without complication   . Hypertension     No past surgical history on file.  No family history on file.  Social History:  has no tobacco, alcohol, and drug history on file.  Allergies:  Allergies  Allergen Reactions  . Other     Pt states "steroids caused increase in blood sugar."     Medications: I have reviewed the patient's current medications.  No results  found for this or any previous visit (from the past 48 hour(s)).  No results found.  Positive for anxiety, depression, learning difficulty, separation anxiety, sleep disturbance and loss of his wife, memory loss, and grief.  Blood pressure 149/88, pulse 56, temperature 97.4 F (36.3 C), temperature source Oral, resp. rate 20, SpO2 100.00%.   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 Patient also requesting to be placed in an assisted living facility so he will be referred to the social services for appropriate placement. Patient grand son, who was at bedside willing to get the help needed for him.  Ailana Cuadrado,Lance R. 07/09/2012, 5:00 PM

## 2012-07-09 NOTE — ED Provider Notes (Signed)
History     CSN: [id number]  Arrival date & time 07/09/12  1601   First MD Initiated Contact with Patient 07/09/12 1609      Chief Complaint  Patient presents with  . Shoulder Pain    (Consider location/radiation/quality/duration/timing/severity/associated sxs/prior treatment) HPI Comments: Pt presenting to the ED for right shoulder pain after a fall at his home approx 3 weeks ago.  Has been seen by his PCP who obtained x-rays which were negative for acute fx or dislocation.  Was given ultram and has been taking daily without relief of sx.  Pain worse with movement above his head and has a grinding sensation while doing so.  Was referred to physician therapy but had to reschedule due to an emergency but states "i don't feel like it would do any good anyway."  Denies any numbness or paresthesias in right hand.    Patient is a 67 y.o. male presenting with shoulder pain. The history is provided by the patient.  Shoulder Pain Associated symptoms include arthralgias.    Past Medical History  Diagnosis Date  . Diabetes mellitus without complication   . Hypertension     No past surgical history on file.  No family history on file.  History  Substance Use Topics  . Smoking status: Not on file  . Smokeless tobacco: Not on file  . Alcohol Use: Not on file      Review of Systems  Musculoskeletal: Positive for arthralgias.  All other systems reviewed and are negative.    Allergies  Other  Home Medications   Current Outpatient Rx  Name  Route  Sig  Dispense  Refill  . atenolol (TENORMIN) 25 MG tablet   Oral   Take 25 mg by mouth daily.         . metFORMIN (GLUCOPHAGE) 1000 MG tablet   Oral   Take 1,000 mg by mouth 2 (two) times daily with a meal.         . Olmesartan-Amlodipine-HCTZ (TRIBENZOR) 40-10-25 MG TABS   Oral   Take 1 tablet by mouth daily.         . traMADol (ULTRAM) 50 MG tablet   Oral   Take 50 mg by mouth every 6 (six) hours.            BP 149/88  Pulse 56  Temp(Src) 97.4 F (36.3 C) (Oral)  Resp 20  SpO2 100%  Physical Exam  Nursing note and vitals reviewed. Constitutional: He is oriented to person, place, and time. He appears well-developed and well-nourished.  HENT:  Head: Normocephalic and atraumatic.  Eyes: Conjunctivae and EOM are normal.  Neck: Normal range of motion. Neck supple.  Cardiovascular: Normal rate, regular rhythm and normal heart sounds.   Pulmonary/Chest: Effort normal and breath sounds normal.  Musculoskeletal: He exhibits no edema.  Decreased ROM of R shoulder due to pain and poor effort, mild crepitus; no bony tenderness, swelling, effusion, laceration, deformity or spasm; strong radial pulse, normal grip strength, sensation intact  Neurological: He is alert and oriented to person, place, and time.  Skin: Skin is warm and dry.  Psychiatric: He has a normal mood and affect.    ED Course  Procedures (including critical care time)  Labs Reviewed - No data to display No results found.   1. Shoulder pain, right       MDM   Pt presenting to the ED for R shoulder pain after a fall down some stairs approx 3 weeks ago.  PCP obtained x-rays- negative for acute fx or dislocation.  No new trauma so will defer imaging at this time.  Was referred to PT but pt did not comply with instructions.  Has been taking ultram without relief of sx.  Requests cortisone shot in his R shoulder.  ROM limited due to pain and poor pt effort.  Mild crepitus noted- likely arthritic changes exacerbated by fall.  Pt drove himself to ED but someone has been contacted to pick him up.  Percocet given in the ED.  Rx vicodin.  Instructed to reschedule his PT, and given contact information for ortho for possible joint injections- Dr. Turner Daniels.  Return precautions advised.       Garlon Hatchet, PA-C 07/09/12 1919

## 2012-07-09 NOTE — ED Notes (Signed)
Pt states he was able to call someone to give him a ride home.

## 2012-07-10 NOTE — ED Provider Notes (Signed)
Medical screening examination/treatment/procedure(s) were performed by non-physician practitioner and as supervising physician I was immediately available for consultation/collaboration. Krysia Zahradnik, MD, FACEP   Deloyd Handy L Cheyan Frees, MD 07/10/12 0000 

## 2012-07-23 ENCOUNTER — Other Ambulatory Visit: Payer: Self-pay | Admitting: Orthopaedic Surgery

## 2012-07-23 DIAGNOSIS — M25511 Pain in right shoulder: Secondary | ICD-10-CM

## 2012-07-30 ENCOUNTER — Ambulatory Visit
Admission: RE | Admit: 2012-07-30 | Discharge: 2012-07-30 | Disposition: A | Discharge status: Home / Self Care | Payer: Medicare Other | Source: Ambulatory Visit | Attending: Orthopaedic Surgery | Admitting: Orthopaedic Surgery

## 2012-07-30 DIAGNOSIS — M25511 Pain in right shoulder: Secondary | ICD-10-CM

## 2014-03-22 ENCOUNTER — Encounter (HOSPITAL_COMMUNITY): Payer: Self-pay | Admitting: *Deleted

## 2014-03-22 ENCOUNTER — Emergency Department (HOSPITAL_COMMUNITY): Payer: Medicare Other

## 2014-03-22 ENCOUNTER — Emergency Department: Payer: Medicare Other

## 2014-03-22 ENCOUNTER — Emergency Department (HOSPITAL_COMMUNITY)
Admission: EM | Admit: 2014-03-22 | Discharge: 2014-03-22 | Disposition: A | Discharge status: Home / Self Care | Payer: No Typology Code available for payment source | Attending: Emergency Medicine | Admitting: Emergency Medicine

## 2014-03-22 ENCOUNTER — Emergency Department (HOSPITAL_COMMUNITY): Payer: No Typology Code available for payment source

## 2014-03-22 ENCOUNTER — Other Ambulatory Visit: Payer: Self-pay

## 2014-03-22 DIAGNOSIS — I10 Essential (primary) hypertension: Secondary | ICD-10-CM | POA: Diagnosis not present

## 2014-03-22 DIAGNOSIS — S40011A Contusion of right shoulder, initial encounter: Secondary | ICD-10-CM | POA: Insufficient documentation

## 2014-03-22 DIAGNOSIS — S7011XA Contusion of right thigh, initial encounter: Secondary | ICD-10-CM

## 2014-03-22 DIAGNOSIS — Z79899 Other long term (current) drug therapy: Secondary | ICD-10-CM | POA: Diagnosis not present

## 2014-03-22 DIAGNOSIS — S7001XA Contusion of right hip, initial encounter: Secondary | ICD-10-CM | POA: Insufficient documentation

## 2014-03-22 DIAGNOSIS — Y92410 Unspecified street and highway as the place of occurrence of the external cause: Secondary | ICD-10-CM | POA: Diagnosis not present

## 2014-03-22 DIAGNOSIS — S161XXA Strain of muscle, fascia and tendon at neck level, initial encounter: Secondary | ICD-10-CM

## 2014-03-22 DIAGNOSIS — E119 Type 2 diabetes mellitus without complications: Secondary | ICD-10-CM | POA: Insufficient documentation

## 2014-03-22 DIAGNOSIS — Y9389 Activity, other specified: Secondary | ICD-10-CM | POA: Insufficient documentation

## 2014-03-22 DIAGNOSIS — Y998 Other external cause status: Secondary | ICD-10-CM | POA: Diagnosis not present

## 2014-03-22 DIAGNOSIS — S4991XA Unspecified injury of right shoulder and upper arm, initial encounter: Secondary | ICD-10-CM | POA: Diagnosis present

## 2014-03-22 HISTORY — DX: Malignant (primary) neoplasm, unspecified: C80.1

## 2014-03-22 LAB — I-STAT TROPONIN, ED: Troponin i, poc: 0 ng/mL (ref 0.00–0.08)

## 2014-03-22 LAB — CBC WITH DIFFERENTIAL/PLATELET
Basophils Absolute: 0.1 10*3/uL (ref 0.0–0.1)
Basophils Relative: 2 % — ABNORMAL HIGH (ref 0–1)
Eosinophils Absolute: 0.1 10*3/uL (ref 0.0–0.7)
Eosinophils Relative: 2 % (ref 0–5)
HCT: 42.5 % (ref 39.0–52.0)
Hemoglobin: 15.2 g/dL (ref 13.0–17.0)
Lymphocytes Relative: 38 % (ref 12–46)
Lymphs Abs: 2.1 10*3/uL (ref 0.7–4.0)
MCH: 30.1 pg (ref 26.0–34.0)
MCHC: 35.8 g/dL (ref 30.0–36.0)
MCV: 84.2 fL (ref 78.0–100.0)
Monocytes Absolute: 0.7 10*3/uL (ref 0.1–1.0)
Monocytes Relative: 13 % — ABNORMAL HIGH (ref 3–12)
Neutro Abs: 2.5 10*3/uL (ref 1.7–7.7)
Neutrophils Relative %: 45 % (ref 43–77)
Platelets: 275 10*3/uL (ref 150–400)
RBC: 5.05 MIL/uL (ref 4.22–5.81)
RDW: 14.2 % (ref 11.5–15.5)
WBC: 5.5 10*3/uL (ref 4.0–10.5)

## 2014-03-22 LAB — URINALYSIS, ROUTINE W REFLEX MICROSCOPIC
Bilirubin Urine: NEGATIVE
Glucose, UA: NEGATIVE mg/dL
Hgb urine dipstick: NEGATIVE
Ketones, ur: NEGATIVE mg/dL
Leukocytes, UA: NEGATIVE
Nitrite: NEGATIVE
Protein, ur: NEGATIVE mg/dL
Specific Gravity, Urine: 1.016 (ref 1.005–1.030)
Urobilinogen, UA: 0.2 mg/dL (ref 0.0–1.0)
pH: 6 (ref 5.0–8.0)

## 2014-03-22 LAB — COMPREHENSIVE METABOLIC PANEL
ALT: 15 U/L (ref 0–53)
AST: 15 U/L (ref 0–37)
Albumin: 4 g/dL (ref 3.5–5.2)
Alkaline Phosphatase: 62 U/L (ref 39–117)
Anion gap: 14 (ref 5–15)
BUN: 14 mg/dL (ref 6–23)
CO2: 26 mEq/L (ref 19–32)
Calcium: 10 mg/dL (ref 8.4–10.5)
Chloride: 100 mEq/L (ref 96–112)
Creatinine, Ser: 0.9 mg/dL (ref 0.50–1.35)
GFR calc Af Amer: >90 mL/min (ref >90)
GFR calc non Af Amer: 85 mL/min — ABNORMAL LOW (ref >90)
Glucose, Bld: 125 mg/dL — ABNORMAL HIGH (ref 70–99)
Potassium: 4 mEq/L (ref 3.7–5.3)
Sodium: 140 mEq/L (ref 137–147)
Total Bilirubin: 0.4 mg/dL (ref 0.3–1.2)
Total Protein: 7.7 g/dL (ref 6.0–8.3)

## 2014-03-22 LAB — RAPID URINE DRUG SCREEN, HOSP PERFORMED
Amphetamines: NOT DETECTED
Barbiturates: NOT DETECTED
Benzodiazepines: NOT DETECTED
Cocaine: NOT DETECTED
Opiates: NOT DETECTED
Tetrahydrocannabinol: NOT DETECTED

## 2014-03-22 LAB — ABO/RH: ABO/RH(D): O POS

## 2014-03-22 LAB — ETHANOL: Alcohol, Ethyl (B): <11 mg/dL (ref 0–11)

## 2014-03-22 LAB — TYPE AND SCREEN
ABO/RH(D): O POS
Antibody Screen: NEGATIVE

## 2014-03-22 LAB — PROTIME-INR
INR: 1.01 (ref 0.00–1.49)
Prothrombin Time: 13.4 seconds (ref 11.6–15.2)

## 2014-03-22 MED ORDER — HYDROCODONE-ACETAMINOPHEN 5-325 MG PO TABS: 1.0000 | ORAL_TABLET | ORAL | Status: DC | PRN

## 2014-03-22 MED ORDER — ONDANSETRON HCL 4 MG/2ML IJ SOLN
4.0000 mg | Freq: Once | INTRAMUSCULAR | Status: AC
  Administered 2014-03-22: 4 mg via INTRAVENOUS
  Filled 2014-03-22: qty 2

## 2014-03-22 MED ORDER — MORPHINE SULFATE 4 MG/ML IJ SOLN
4.0000 mg | Freq: Once | INTRAMUSCULAR | Status: AC
  Administered 2014-03-22: 4 mg via INTRAVENOUS
  Filled 2014-03-22: qty 1
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
## 2014-03-30 ENCOUNTER — Other Ambulatory Visit: Payer: Self-pay | Admitting: Orthopaedic Surgery

## 2014-03-30 DIAGNOSIS — M25511 Pain in right shoulder: Secondary | ICD-10-CM

## 2014-03-30 DIAGNOSIS — M542 Cervicalgia: Secondary | ICD-10-CM

## 2014-04-07 ENCOUNTER — Ambulatory Visit
Admission: RE | Admit: 2014-04-07 | Discharge: 2014-04-07 | Disposition: A | Discharge status: Home / Self Care | Payer: Medicare Other | Source: Ambulatory Visit | Attending: Orthopaedic Surgery | Admitting: Orthopaedic Surgery

## 2014-04-07 DIAGNOSIS — M25511 Pain in right shoulder: Secondary | ICD-10-CM

## 2014-04-07 DIAGNOSIS — M542 Cervicalgia: Secondary | ICD-10-CM

## 2014-04-14 ENCOUNTER — Ambulatory Visit: Payer: Medicare Other | Attending: Orthopaedic Surgery

## 2014-04-14 ENCOUNTER — Telehealth: Payer: Self-pay | Admitting: *Deleted

## 2014-04-14 DIAGNOSIS — M25511 Pain in right shoulder: Secondary | ICD-10-CM | POA: Diagnosis present

## 2014-04-14 DIAGNOSIS — M436 Torticollis: Secondary | ICD-10-CM | POA: Insufficient documentation

## 2014-04-14 DIAGNOSIS — M542 Cervicalgia: Secondary | ICD-10-CM

## 2014-04-14 DIAGNOSIS — M25611 Stiffness of right shoulder, not elsewhere classified: Secondary | ICD-10-CM | POA: Diagnosis not present

## 2014-04-14 DIAGNOSIS — M5030 Other cervical disc degeneration, unspecified cervical region: Secondary | ICD-10-CM
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
## 2014-04-14 NOTE — Telephone Encounter (Signed)
APPTS MADE AND PRINTED...TD 

## 2014-04-14 NOTE — Patient Instructions (Signed)
Sit with support under RT arm at all times.  . Heat or cold 2-3x/day.       Watch posture , sit erect

## 2014-04-20 ENCOUNTER — Ambulatory Visit: Payer: No Typology Code available for payment source | Attending: Orthopaedic Surgery | Admitting: Physical Therapy

## 2014-04-20 DIAGNOSIS — M5030 Other cervical disc degeneration, unspecified cervical region: Secondary | ICD-10-CM | POA: Diagnosis not present

## 2014-04-20 DIAGNOSIS — M25511 Pain in right shoulder: Secondary | ICD-10-CM | POA: Diagnosis present

## 2014-04-20 DIAGNOSIS — M542 Cervicalgia: Secondary | ICD-10-CM | POA: Diagnosis not present

## 2014-04-20 DIAGNOSIS — M436 Torticollis: Secondary | ICD-10-CM | POA: Diagnosis not present

## 2014-04-20 DIAGNOSIS — M25611 Stiffness of right shoulder, not elsewhere classified: Secondary | ICD-10-CM

## 2014-04-20 NOTE — Therapy (Signed)
[place], [place], [address] Phone: [phone]   Fax:  [phone]  Physical Therapy Treatment  Patient Details  Name: Lance Crosby MRN: [id number] Date of Birth: 1945-08-23  Encounter Date: 04/20/2014      PT End of Session - 04/20/14 1747    Visit Number 2   Number of Visits 18   Date for PT Re-Evaluation 06/13/14   PT Start Time 0383   PT Stop Time 1515   PT Time Calculation (min) 58 min   Activity Tolerance Patient tolerated treatment well;Patient limited by pain;Patient limited by fatigue      Past Medical History  Diagnosis Date  . Diabetes mellitus without complication   . Hypertension   . Cancer     Past Surgical History  Procedure Laterality Date  . Colon surgery    . Rotator cuff repair      There were no vitals taken for this visit.  Visit Diagnosis:  Pain in joint, shoulder region, right  Stiffness of shoulder joint, right  Cervicalgia  Stiffness of neck      Subjective Assessment - 04/20/14 1420    Symptoms 7/10 neck and shoulder pain, always has shoulder pain.  Neck worse in am. Stretches help. Neck Range.   Currently in Pain? Yes   Pain Score 7    Pain Location Shoulder   Pain Orientation Right   Pain Descriptors / Indicators Aching  tootth ach, neck stiff.   Pain Radiating Towards neck   Aggravating Factors  staying still in one position at night, 1st thing in am.    Pain Relieving Factors medication   Effect of Pain on Daily Activities limited   Multiple Pain Sites No                    [facility] Adult PT Treatment/Exercise - 04/20/14 1420    Neck Exercises: Stretches   Upper Trapezius Stretch 3 reps;10 seconds  Rt   Other Neck Stretches retraction 5 reps 5 sedond holds   Shoulder Exercises: Seated   Elevation AROM   Retraction 10 reps   Shoulder Exercises: Standing   Theraband Level (Shoulder External Rotation) Level 2 (Red)  did not add  to home exercises, 10 reps   Theraband Level (Shoulder Internal Rotation) Level 1 (Yellow)  did not add to home exercise   Theraband Level (Shoulder Row) Level 2 (Red)  10 reps, added to home exercises.   Other Standing Exercises band yellow, punch, added to home exercises   Other Standing Exercises --  green band for shoulder depression, able to do correctly w   Shoulder Exercises: Pulleys   Flexion Limitations  3.5 minutes, fatigue   Modalities   Modalities Moist Heat;Electrical Stimulation   Electrical Stimulation   Electrical Stimulation Location Neck, shoulder   Electrical Stimulation Action IFC   Electrical Stimulation Parameters To tolerance   Electrical Stimulation Goals Pain   Manual Therapy   Manual Therapy --  showed patient how to do trigger point massage to neck scale                PT Education - 04/20/14 1746    Education provided Yes   Education Details bands for row and punch   Person(s) Educated Patient   Methods Explanation;Demonstration;Tactile cues   Comprehension Verbalized understanding;Returned demonstration          PT Short Term Goals - 04/20/14 1749    PT SHORT TERM GOAL #  1   Title Independent with intial HEP   Time 4   Period Weeks   Status On-going   PT SHORT TERM GOAL #2   Title He will report decr pain by 40-50% in neck and shoulder.    Time 4   Period Weeks   Status On-going   PT SHORT TERM GOAL #3   Title He will improve cervical rotation to 60 degredes bilaterally   Time 4   Period Weeks   Status Unable to assess   PT SHORT TERM GOAL #4   Title He will improve active Rt shoulder elevation to 130 degrees   Time 4   Period Weeks   Status On-going           PT Long Term Goals - 04/14/14 1594    PT LONG TERM GOAL #1   Title He will be able to do HEP issued as of last visit   Time 8   Period Weeks   Status New   PT LONG TERM GOAL #2   Title He will report pain decreased 80% or more in neck and shoulder   Time 8    Period Weeks   Status New   PT LONG TERM GOAL #3   Title He will report able to reach and grasp with min to no pain.    Time 8   Period Weeks   Status New   PT LONG TERM GOAL #4   Title He will report self care and home tasks with 1-2 max pain in shoulder and neck   PT LONG TERM GOAL #5   Title He will report no symptoms into Rt hand   Time 8   Period Weeks   Status New               Plan - 04/20/14 1748    Clinical Impression Statement Able to progress home exercdise goals.  Patient interested in what he can do at home.   PT Next Visit Plan review home exercises, add doorway stretches, Neck ROM, Measure?   Consulted and Agree with Plan of Care Patient        Problem List Patient Active Problem List   Diagnosis Date Noted  . FLATULENCE-GAS-BLOATING 11/14/2009  . PERSONAL HX COLON CANCER 11/14/2009   [name], PTA 04/20/2014 5:52 PM Phone: [phone] Fax: [phone]  [facility] 04/20/2014, 5:52 PM  [place], [place], [address] Phone: [phone]   Fax:  [phone]

## 2014-04-20 NOTE — Patient Instructions (Signed)
Work on posture

## 2014-04-22 ENCOUNTER — Ambulatory Visit: Payer: No Typology Code available for payment source

## 2014-04-22 DIAGNOSIS — M25511 Pain in right shoulder: Secondary | ICD-10-CM

## 2014-04-22 DIAGNOSIS — M25611 Stiffness of right shoulder, not elsewhere classified: Secondary | ICD-10-CM

## 2014-04-22 DIAGNOSIS — M542 Cervicalgia: Secondary | ICD-10-CM

## 2014-04-22 DIAGNOSIS — M436 Torticollis: Secondary | ICD-10-CM

## 2014-04-22 DIAGNOSIS — M5030 Other cervical disc degeneration, unspecified cervical region: Secondary | ICD-10-CM

## 2014-04-22 NOTE — Therapy (Signed)
[place], [place], [address] Phone: [phone]   Fax:  [phone]  Physical Therapy Treatment  Patient Details  Name: Lance Crosby MRN: [id number] Date of Birth: [birth year] Referring Provider:  Garald Balding, MD  Encounter Date: 04/22/2014      PT End of Session - 04/22/14 1422    Visit Number 3   Date for PT Re-Evaluation 06/13/14   PT Start Time 8841   PT Stop Time 1520   PT Time Calculation (min) 60 min   Activity Tolerance Patient tolerated treatment well   Behavior During Therapy [facility] for tasks assessed/performed      Past Medical History  Diagnosis Date  . Diabetes mellitus without complication   . Hypertension   . Cancer     Past Surgical History  Procedure Laterality Date  . Colon surgery    . Rotator cuff repair      There were no vitals taken for this visit.  Visit Diagnosis:  Pain in joint, shoulder region, right  Stiffness of shoulder joint, right  Cervicalgia  Stiffness of neck  DDD (degenerative disc disease), cervical      Subjective Assessment - 04/22/14 1418    Symptoms Sore from exercises but exercise helps   Currently in Pain? Yes   Pain Score 7    Pain Location Shoulder  neck   Pain Orientation Right   Pain Descriptors / Indicators Sore   Pain Type Acute pain   Pain Onset 1 to 4 weeks ago   Pain Frequency Constant   Aggravating Factors  AM pain worse .    Pain Relieving Factors Medication   Multiple Pain Sites No                    [facility] Adult PT Treatment/Exercise - 04/22/14 1421    Shoulder Exercises: Pulleys   Flexion --  5 min   Moist Heat Therapy   Number Minutes Moist Heat 20 Minutes   Moist Heat Location Shoulder  neck   Electrical Stimulation   Electrical Stimulation Location Neck, shoulder   Electrical Stimulation Action IFC   Electrical Stimulation Parameters To tolerance   Electrical Stimulation Goals Pain   Manual Therapy   Manual Therapy Passive ROM;Massage;Myofascial release;Joint mobilization   Joint Mobilization Distraction with AP glides Gr 2-3   Massage  to neck and traps with traction, suboccipiral release   Myofascial Release Movement with soft tissue compression for rotation andabduction   Passive ROM RT shoulder range rotation flexion /extension                  PT Short Term Goals - 04/20/14 1749    PT SHORT TERM GOAL #1   Title Independent with intial HEP   Time 4   Period Weeks   Status On-going   PT SHORT TERM GOAL #2   Title He will report decr pain by 40-50% in neck and shoulder.    Time 4   Period Weeks   Status On-going   PT SHORT TERM GOAL #3   Title He will improve cervical rotation to 60 degredes bilaterally   Time 4   Period Weeks   Status Unable to assess   PT SHORT TERM GOAL #4   Title He will improve active Rt shoulder elevation to 130 degrees   Time 4   Period Weeks   Status On-going           PT Long  Term Goals - 04/14/14 0841    PT LONG TERM GOAL #1   Title He will be able to do HEP issued as of last visit   Time 8   Period Weeks   Status New   PT LONG TERM GOAL #2   Title He will report pain decreased 80% or more in neck and shoulder   Time 8   Period Weeks   Status New   PT LONG TERM GOAL #3   Title He will report able to reach and grasp with min to no pain.    Time 8   Period Weeks   Status New   PT LONG TERM GOAL #4   Title He will report self care and home tasks with 1-2 max pain in shoulder and neck   PT LONG TERM GOAL #5   Title He will report no symptoms into Rt hand   Time 8   Period Weeks   Status New               Plan - 04/22/14 1502    Clinical Impression Statement Tolerated treatment well no pain improvement. Passively shoulder range WNL but with end range pain   PT Frequency 3x / week   PT Duration 2 weeks   PT Treatment/Interventions ADLs/Self Care Home Management   PT Next Visit Plan cont  manual, exercise as per previous session and add stretche as able   PT Home Exercise Plan Stretching   Consulted and Agree with Plan of Care Patient        Problem List Patient Active Problem List   Diagnosis Date Noted  . FLATULENCE-GAS-BLOATING 11/14/2009  . PERSONAL HX COLON CANCER 11/14/2009    Darrel Hoover PT 04/22/2014, 3:04 PM  [place] [facility] [address] [place], [place], [address] Phone: [phone]   Fax:  [phone]

## 2014-04-23 ENCOUNTER — Ambulatory Visit: Payer: No Typology Code available for payment source

## 2014-04-23 DIAGNOSIS — M25511 Pain in right shoulder: Secondary | ICD-10-CM | POA: Diagnosis not present

## 2014-04-23 DIAGNOSIS — M436 Torticollis: Secondary | ICD-10-CM

## 2014-04-23 DIAGNOSIS — M542 Cervicalgia: Secondary | ICD-10-CM

## 2014-04-23 DIAGNOSIS — M25611 Stiffness of right shoulder, not elsewhere classified: Secondary | ICD-10-CM

## 2014-04-23 DIAGNOSIS — M5030 Other cervical disc degeneration, unspecified cervical region: Secondary | ICD-10-CM

## 2014-04-23 NOTE — Therapy (Signed)
[place], [place], [address] Phone: [phone]   Fax:  [phone]  Physical Therapy Treatment  Patient Details  Name: Lance Crosby MRN: [id number] Date of Birth: 1945-05-18 Referring Provider:  Garald Balding, MD  Encounter Date: 04/23/2014      PT End of Session - 04/23/14 1037    Visit Number 4   Number of Visits 18   Date for PT Re-Evaluation 06/13/14   PT Start Time 1035  Late for 1015 appointment   PT Stop Time 1115   PT Time Calculation (min) 40 min   Activity Tolerance Patient tolerated treatment well   Behavior During Therapy [facility] for tasks assessed/performed      Past Medical History  Diagnosis Date  . Diabetes mellitus without complication   . Hypertension   . Cancer     Past Surgical History  Procedure Laterality Date  . Colon surgery    . Rotator cuff repair      There were no vitals taken for this visit.  Visit Diagnosis:  Pain in joint, shoulder region, right  Stiffness of shoulder joint, right  Cervicalgia  Stiffness of neck  DDD (degenerative disc disease), cervical      Subjective Assessment - 04/23/14 1035    Symptoms Yesterday was helpful   Currently in Pain? Yes   Pain Score 5    Pain Location Neck  Shoulder   Pain Descriptors / Indicators Sore   Pain Type Acute pain   Pain Onset 1 to 4 weeks ago   Pain Frequency Constant   Multiple Pain Sites No          [facility] PT Assessment - 04/23/14 1040    AROM   Right Shoulder Flexion 138 Degrees   Right Shoulder External Rotation 75 Degrees   Left Shoulder Flexion 145 Degrees                  [facility] Adult PT Treatment/Exercise - 04/23/14 1037    Shoulder Exercises: Pulleys   Flexion 3 minutes   Modalities   Modalities Traction   Moist Heat Therapy   Number Minutes Moist Heat 20 Minutes   Moist Heat Location Shoulder   Electrical Stimulation   Electrical Stimulation Location Neck,  shoulder   Electrical Stimulation Action IFC   Electrical Stimulation Parameters to tolerance   Electrical Stimulation Goals Pain   Traction   Type of Traction Cervical   Min (lbs) 5   Max (lbs) 12   Hold Time 60   Rest Time 15   Time 10   Manual Therapy   Manual Therapy --   Joint Mobilization --   Massage --   Myofascial Release --   Passive ROM --                  PT Short Term Goals - 04/20/14 1749    PT SHORT TERM GOAL #1   Title Independent with intial HEP   Time 4   Period Weeks   Status On-going   PT SHORT TERM GOAL #2   Title He will report decr pain by 40-50% in neck and shoulder.    Time 4   Period Weeks   Status On-going   PT SHORT TERM GOAL #3   Title He will improve cervical rotation to 60 degredes bilaterally   Time 4   Period Weeks   Status Unable to assess   PT SHORT TERM GOAL #4  Title He will improve active Rt shoulder elevation to 130 degrees   Time 4   Period Weeks   Status On-going           PT Long Term Goals - 04/14/14 5110    PT LONG TERM GOAL #1   Title He will be able to do HEP issued as of last visit   Time 8   Period Weeks   Status New   PT LONG TERM GOAL #2   Title He will report pain decreased 80% or more in neck and shoulder   Time 8   Period Weeks   Status New   PT LONG TERM GOAL #3   Title He will report able to reach and grasp with min to no pain.    Time 8   Period Weeks   Status New   PT LONG TERM GOAL #4   Title He will report self care and home tasks with 1-2 max pain in shoulder and neck   PT LONG TERM GOAL #5   Title He will report no symptoms into Rt hand   Time 8   Period Weeks   Status New               Plan - 04/23/14 1104    Clinical Impression Statement Tolerated traction without increased pain. Will assess next visit to add traction to treatments   Pt will benefit from skilled therapeutic intervention in order to improve on the following deficits Decreased range of  motion;Pain;Decreased activity tolerance   Rehab Potential Good   PT Frequency 3x / week   PT Duration --  1 weeks then decr to 2x/week   PT Treatment/Interventions ADLs/Self Care Home Management   PT Next Visit Plan cont manual, exercise as per previous session and add stretche as able, traction if helpful   PT Home Exercise Plan Stretching   Consulted and Agree with Plan of Care Patient        Problem List Patient Active Problem List   Diagnosis Date Noted  . FLATULENCE-GAS-BLOATING 11/14/2009  . PERSONAL HX COLON CANCER 11/14/2009    Darrel Hoover PT 04/23/2014, 11:07 AM  [facility] [address] [place], [place], [address] Phone: [phone]   Fax:  [phone]

## 2014-04-26 ENCOUNTER — Ambulatory Visit: Payer: No Typology Code available for payment source

## 2014-04-26 DIAGNOSIS — M25611 Stiffness of right shoulder, not elsewhere classified: Secondary | ICD-10-CM

## 2014-04-26 DIAGNOSIS — M436 Torticollis: Secondary | ICD-10-CM

## 2014-04-26 DIAGNOSIS — M542 Cervicalgia: Secondary | ICD-10-CM

## 2014-04-26 DIAGNOSIS — M25511 Pain in right shoulder: Secondary | ICD-10-CM

## 2014-04-26 DIAGNOSIS — M5030 Other cervical disc degeneration, unspecified cervical region: Secondary | ICD-10-CM

## 2014-04-26 NOTE — Therapy (Signed)
[place], [place], [address] Phone: [phone]   Fax:  [phone]  Physical Therapy Treatment  Patient Details  Name: Lance Crosby MRN: [id number] Date of Birth: [birth year] Referring Provider:  Garald Balding, MD  Encounter Date: 04/26/2014      PT End of Session - 04/26/14 1445    Visit Number 5   Number of Visits 18   Date for PT Re-Evaluation 06/13/14   PT Start Time 3474   PT Stop Time 1515   PT Time Calculation (min) 60 min   Activity Tolerance Patient tolerated treatment well   Behavior During Therapy [facility] for tasks assessed/performed      Past Medical History  Diagnosis Date  . Diabetes mellitus without complication   . Hypertension   . Cancer     Past Surgical History  Procedure Laterality Date  . Colon surgery    . Rotator cuff repair      There were no vitals taken for this visit.  Visit Diagnosis:  Pain in joint, shoulder region, right  Stiffness of shoulder joint, right  Cervicalgia  Stiffness of neck  DDD (degenerative disc disease), cervical      Subjective Assessment - 04/26/14 1417    Symptoms Doing exercise at home and now bicep is sore.  Tingle still in hand. May have had less hand tingle after traction   How long can you sit comfortably? 15 min (back knees)   Currently in Pain? Yes   Pain Score 5    Pain Location Neck  deltoid area   Pain Orientation Right   Pain Descriptors / Indicators Sore   Pain Type Acute pain   Pain Onset 1 to 4 weeks ago   Pain Frequency Constant   Aggravating Factors  Active use arm , computer work,    Pain Relieving Factors MEDs    Multiple Pain Sites No          [facility] PT Assessment - 04/26/14 1422    AROM   Cervical Flexion 40   Cervical Extension 48   Cervical - Right Side Bend 38   Cervical - Left Side Bend 57   Cervical - Right Rotation 60                  [facility] Adult PT Treatment/Exercise -  04/26/14 1422    Shoulder Exercises: Pulleys   Flexion --  5 min   Moist Heat Therapy   Number Minutes Moist Heat 15 Minutes   Moist Heat Location Shoulder  neck   Traction   Type of Traction Cervical   Min (lbs) 5   Max (lbs) 15   Hold Time 60   Rest Time 15   Time 12   Manual Therapy   Manual Therapy Passive ROM;Massage;Myofascial release;Joint mobilization   Joint Mobilization Distraction with gr2-3 glides   Massage To neck and shoulder   Passive ROM RT shoulder range    and cervical range                  PT Short Term Goals - 04/20/14 1749    PT SHORT TERM GOAL #1   Title Independent with intial HEP   Time 4   Period Weeks   Status On-going   PT SHORT TERM GOAL #2   Title He will report decr pain by 40-50% in neck and shoulder.    Time 4   Period Weeks   Status On-going  PT SHORT TERM GOAL #3   Title He will improve cervical rotation to 60 degredes bilaterally   Time 4   Period Weeks   Status Unable to assess   PT SHORT TERM GOAL #4   Title He will improve active Rt shoulder elevation to 130 degrees   Time 4   Period Weeks   Status On-going           PT Long Term Goals - 04/14/14 9767    PT LONG TERM GOAL #1   Title He will be able to do HEP issued as of last visit   Time 8   Period Weeks   Status New   PT LONG TERM GOAL #2   Title He will report pain decreased 80% or more in neck and shoulder   Time 8   Period Weeks   Status New   PT LONG TERM GOAL #3   Title He will report able to reach and grasp with min to no pain.    Time 8   Period Weeks   Status New   PT LONG TERM GOAL #4   Title He will report self care and home tasks with 1-2 max pain in shoulder and neck   PT LONG TERM GOAL #5   Title He will report no symptoms into Rt hand   Time 8   Period Weeks   Status New               Plan - 04/26/14 1446    Clinical Impression Statement PAin levels not improved but may have been better after traction. Need to add  shoulder exercise probably bands vs isometrics   Pt will benefit from skilled therapeutic intervention in order to improve on the following deficits Decreased range of motion;Pain;Decreased activity tolerance   Rehab Potential Good   PT Frequency 3x / week   PT Duration 2 weeks   PT Treatment/Interventions ADLs/Self Care Home Management   PT Next Visit Plan cont manual, exercise as per previous session and add stretche as able, traction if helpful, Add band exercise or cervical stab exercises next visit,    GOAL ASSESSMENT   PT Home Exercise Plan Stretching,    Consulted and Agree with Plan of Care Patient        Problem List Patient Active Problem List   Diagnosis Date Noted  . FLATULENCE-GAS-BLOATING 11/14/2009  . PERSONAL HX COLON CANCER 11/14/2009    Darrel Hoover  PT  04/26/2014, 2:49 PM  [place] [facility] [address] [place], [place], [address] Phone: [phone]   Fax:  [phone]

## 2014-04-28 ENCOUNTER — Ambulatory Visit: Payer: No Typology Code available for payment source | Admitting: Physical Therapy

## 2014-04-29 ENCOUNTER — Ambulatory Visit: Payer: No Typology Code available for payment source | Admitting: Rehabilitation

## 2014-04-29 DIAGNOSIS — M25611 Stiffness of right shoulder, not elsewhere classified: Secondary | ICD-10-CM

## 2014-04-29 DIAGNOSIS — M5030 Other cervical disc degeneration, unspecified cervical region: Secondary | ICD-10-CM

## 2014-04-29 DIAGNOSIS — M436 Torticollis: Secondary | ICD-10-CM

## 2014-04-29 DIAGNOSIS — M25511 Pain in right shoulder: Secondary | ICD-10-CM | POA: Diagnosis not present

## 2014-04-29 DIAGNOSIS — M542 Cervicalgia: Secondary | ICD-10-CM

## 2014-04-29 NOTE — Therapy (Signed)
[place], [place], [address] Phone: [phone]   Fax:  [phone]  Physical Therapy Treatment  Patient Details  Name: RUBIN DAIS MRN: [id number] Date of Birth: 05/01/1945 Referring Provider:  Garald Balding, MD  Encounter Date: 04/29/2014      PT End of Session - 04/29/14 0953    Visit Number 6   Number of Visits 18   Date for PT Re-Evaluation 06/13/14   PT Start Time 0932   PT Stop Time 1030   PT Time Calculation (min) 58 min      Past Medical History  Diagnosis Date  . Diabetes mellitus without complication   . Hypertension   . Cancer     Past Surgical History  Procedure Laterality Date  . Colon surgery    . Rotator cuff repair      There were no vitals taken for this visit.  Visit Diagnosis:  Pain in joint, shoulder region, right  Stiffness of shoulder joint, right  Cervicalgia  Stiffness of neck  DDD (degenerative disc disease), cervical      Subjective Assessment - 04/29/14 0935    Symptoms Right shoulder and bicep pain. Continued tingle in hand.    Patient Stated Goals Imrove movement and use arms , return to exercise, decrease pain for activity   Currently in Pain? Yes   Pain Score 5    Pain Location Neck  shoulder   Pain Orientation Right;Lateral   Pain Descriptors / Indicators --  toothache,   Pain Type Acute pain   Pain Onset 1 to 4 weeks ago          [facility] PT Assessment - 04/29/14 0949    AROM   Right Shoulder Flexion 135 Degrees   Left Shoulder Flexion 140 Degrees   Cervical - Right Side Bend 35   Cervical - Left Side Bend 40   Cervical - Right Rotation 50   Cervical - Left Rotation 60                  [facility] Adult PT Treatment/Exercise - 04/29/14 0940    Neck Exercises: Stretches   Upper Trapezius Stretch 3 reps;30 seconds   Levator Stretch 3 reps;30 seconds   Corner Stretch 3 reps;30 seconds   Neck Exercises: Seated   Neck  Retraction 10 reps;5 secs   Shoulder Exercises: Supine   Horizontal ABduction Both;10 reps;Theraband  cues to depress scap   Theraband Level (Shoulder Horizontal ABduction) Level 1 (Yellow)   Other Supine Exercises Diagonals with yellow band x 10 each  cues to depress scap   Shoulder Exercises: Seated   Retraction AROM;10 reps  5 sec   Shoulder Exercises: Stretch   Wall Stretch - Flexion 5 reps;10 seconds  10 reps focus on depress scap with wall slides   Moist Heat Therapy   Number Minutes Moist Heat 15 Minutes   Moist Heat Location Shoulder  neck   Electrical Stimulation   Electrical Stimulation Location Neck, shoulder   Electrical Stimulation Action IFC   Electrical Stimulation Parameters 9   Electrical Stimulation Goals Pain   Manual Therapy   Manual Therapy Massage   Massage Active release to bil levator scap                PT Education - 04/29/14 1035    Education provided Yes   Education Details Neck Tension, Supine Scap unattached   Person(s) Educated Patient   Methods Explanation;Handout  Comprehension Verbalized understanding          PT Short Term Goals - 04/29/14 1040    PT SHORT TERM GOAL #1   Title Independent with intial HEP   Time 4   Period Weeks   Status Achieved   PT SHORT TERM GOAL #2   Title He will report decr pain by 40-50% in neck and shoulder.    Time 4   Period Weeks   Status On-going   PT SHORT TERM GOAL #3   Title He will improve cervical rotation to 60 degredes bilaterally   Time 4   Period Weeks   Status On-going   PT SHORT TERM GOAL #4   Title He will improve active Rt shoulder elevation to 130 degrees   Time 4   Period Weeks   Status Achieved           PT Long Term Goals - 04/29/14 1040    PT LONG TERM GOAL #1   Title He will be able to do HEP issued as of last visit   Time 8   Period Weeks   Status On-going   PT LONG TERM GOAL #2   Title He will report pain decreased 80% or more in neck and shoulder    Time 8   Period Weeks   Status On-going   PT LONG TERM GOAL #3   Title He will report able to reach and grasp with min to no pain.    Time 8   Period Weeks   Status Unable to assess   PT LONG TERM GOAL #4   Title He will report self care and home tasks with 1-2 max pain in shoulder and neck   Time 8   Period Weeks   Status On-going   PT LONG TERM GOAL #5   Title He will report no symptoms into Rt hand   Time 8   Period Weeks   Status On-going               Plan - 04/29/14 0953    Clinical Impression Statement Pt reports minimal improvement in pain and stiffness. He reports maybe 5 % decrease in pain.        Problem List Patient Active Problem List   Diagnosis Date Noted  . FLATULENCE-GAS-BLOATING 11/14/2009  . PERSONAL HX COLON CANCER 11/14/2009    Dorene Ar 04/29/2014, 10:47 AM  [facility] [address] [place], [place], [address] Phone: [phone]   Fax:  [phone]

## 2014-04-29 NOTE — Patient Instructions (Addendum)
Levator Stretch   Grasp seat or sit on hand on side to be stretched. Turn head toward other side and look down. Use hand on head to gently stretch neck in that position. Hold __10__ seconds. Repeat on other side. Repeat __3__ times. Do _2___ sessions per day.  http://gt2.exer.us/30   Copyright  VHI. All rights reserved.  Side-Bending   One hand on opposite side of head, pull head to side as far as is comfortable. Stop if there is pain. Hold __10__ seconds. Repeat with other hand to other side. Repeat ___3_ times. Do _2___ sessions per day.   Copyright  VHI. All rights reserved.  Scapular Retraction (Standing)   With arms at sides, pinch shoulder blades together. Repeat _10___ times per set. Do __1__ sets per session. Do 2____ sessions per day.  http://orth.exer.us/944   Copyright  VHI. All rights reserved.  Chin Protraction / Retraction   Slide head forward keeping chin level. Slide head back, pulling chin in. Hold each position _5__ seconds. Repeat __10_ times. Do _2__ sessions per day.  Copyright  VHI. All rights reserved.     PNF Strengthening: Resisted PERFORM LAYING DOWN, KEEP SHOULDERS DOWN   Standing with resistive band around each hand, bring right arm up and away, thumb back. Repeat _10___ times per set each arn. Do _1___ sets per session. Do ___2_ sessions per day.  http://orth.exer.us/918   ALSO PULL BAND STRAIGHT APART x10, KEEP SHOULDERS DOWN!!!! 10 reps, 2 sessions per day.

## 2014-04-30 ENCOUNTER — Ambulatory Visit: Payer: No Typology Code available for payment source

## 2014-05-03 ENCOUNTER — Ambulatory Visit: Payer: No Typology Code available for payment source

## 2014-05-03 DIAGNOSIS — M25611 Stiffness of right shoulder, not elsewhere classified: Secondary | ICD-10-CM

## 2014-05-03 DIAGNOSIS — M5030 Other cervical disc degeneration, unspecified cervical region: Secondary | ICD-10-CM

## 2014-05-03 DIAGNOSIS — M542 Cervicalgia: Secondary | ICD-10-CM

## 2014-05-03 DIAGNOSIS — M436 Torticollis: Secondary | ICD-10-CM

## 2014-05-03 DIAGNOSIS — M25511 Pain in right shoulder: Secondary | ICD-10-CM | POA: Diagnosis not present

## 2014-05-03 NOTE — Therapy (Signed)
[place], [place], [address] Phone: [phone]   Fax:  [phone]  Physical Therapy Treatment  Patient Details  Name: Lance Crosby MRN: [id number] Date of Birth: 1945/12/13 Referring Provider:  Garald Balding, MD  Encounter Date: 05/03/2014      PT End of Session - 05/03/14 1319    Visit Number 7   Number of Visits 18   Date for PT Re-Evaluation 06/13/14   PT Start Time 5852   PT Stop Time 1335   PT Time Calculation (min) 60 min   Activity Tolerance Patient tolerated treatment well   Behavior During Therapy [facility] for tasks assessed/performed      Past Medical History  Diagnosis Date  . Diabetes mellitus without complication   . Hypertension   . Cancer     Past Surgical History  Procedure Laterality Date  . Colon surgery    . Rotator cuff repair      There were no vitals taken for this visit.  Visit Diagnosis:  Pain in joint, shoulder region, right  Stiffness of shoulder joint, right  Cervicalgia  Stiffness of neck  DDD (degenerative disc disease), cervical      Subjective Assessment - 05/03/14 1240    Symptoms Sore from execies 4-5/10.  I told him we would do 3 more visits and if still 4-5/10 would have him return to MD. The electrical treatment easdes pain .    Currently in Pain? Yes   Pain Score 5    Pain Location Neck  shoulder   Pain Orientation Right;Left   Pain Type Acute pain   Pain Onset More than a month ago   Pain Frequency Constant   Aggravating Factors  using arm  and computer work   Pain Relieving Factors Meds    Multiple Pain Sites No                    [facility] Adult PT Treatment/Exercise - 05/03/14 1242    Shoulder Exercises: Seated   Retraction AROM;10 reps   Row AROM;Both;12 reps  horizontal abduciton   Theraband Level (Shoulder Row) Level 1 (Yellow)   External Rotation Strengthening;Both;12 reps   Theraband Level (Shoulder External  Rotation) Level 1 (Yellow)   Moist Heat Therapy   Number Minutes Moist Heat 15 Minutes   Moist Heat Location Shoulder  neck   Electrical Stimulation   Electrical Stimulation Location Neck, shoulder   Electrical Stimulation Action IFC   Electrical Stimulation Parameters 9   Electrical Stimulation Goals Pain   Manual Therapy   Manual Therapy Massage   Massage PA glides to C2 to T 4 Gr 3-4 with gentile distraction                  PT Short Term Goals - 04/29/14 1040    PT SHORT TERM GOAL #1   Title Independent with intial HEP   Time 4   Period Weeks   Status Achieved   PT SHORT TERM GOAL #2   Title He will report decr pain by 40-50% in neck and shoulder.    Time 4   Period Weeks   Status On-going   PT SHORT TERM GOAL #3   Title He will improve cervical rotation to 60 degredes bilaterally   Time 4   Period Weeks   Status On-going   PT SHORT TERM GOAL #4   Title He will improve active Rt shoulder elevation to 130 degrees  Time 4   Period Weeks   Status Achieved           PT Long Term Goals - 04/29/14 1040    PT LONG TERM GOAL #1   Title He will be able to do HEP issued as of last visit   Time 8   Period Weeks   Status On-going   PT LONG TERM GOAL #2   Title He will report pain decreased 80% or more in neck and shoulder   Time 8   Period Weeks   Status On-going   PT LONG TERM GOAL #3   Title He will report able to reach and grasp with min to no pain.    Time 8   Period Weeks   Status Unable to assess   PT LONG TERM GOAL #4   Title He will report self care and home tasks with 1-2 max pain in shoulder and neck   Time 8   Period Weeks   Status On-going   PT LONG TERM GOAL #5   Title He will report no symptoms into Rt hand   Time 8   Period Weeks   Status On-going               Plan - 05/03/14 1319    Clinical Impression Statement He is not improving so 3 more visits then return to MD if not better   Pt will benefit from skilled  therapeutic intervention in order to improve on the following deficits Decreased range of motion;Pain;Decreased activity tolerance   Rehab Potential Good   PT Frequency 3x / week   PT Duration --  1 week   PT Treatment/Interventions Moist Heat;Electrical Stimulation;Manual techniques;Passive range of motion;Patient/family education;Therapeutic exercise;Traction   PT Next Visit Plan cont manual, exercise as per previous session and add stretche as able, traction if helpful, Add band exercise or cervical stab exercises next visit,    GOAL ASSESSMENT   PT Home Exercise Plan Stretching,    Consulted and Agree with Plan of Care Patient        Problem List Patient Active Problem List   Diagnosis Date Noted  . FLATULENCE-GAS-BLOATING 11/14/2009  . PERSONAL HX COLON CANCER 11/14/2009    Darrel Hoover PT 05/03/2014, 1:22 PM  [place] [facility] [address] [place], [place], [address] Phone: [phone]   Fax:  [phone]

## 2014-05-05 ENCOUNTER — Ambulatory Visit: Payer: No Typology Code available for payment source | Admitting: Physical Therapy

## 2014-05-05 DIAGNOSIS — M436 Torticollis: Secondary | ICD-10-CM

## 2014-05-05 DIAGNOSIS — M25511 Pain in right shoulder: Secondary | ICD-10-CM

## 2014-05-05 NOTE — Therapy (Signed)
[place], [place], [address] Phone: [phone]   Fax:  [phone]  Physical Therapy Treatment  Patient Details  Name: Lance Crosby MRN: [id number] Date of Birth: 06-04-1945 Referring Provider:  Garald Balding, MD  Encounter Date: 05/05/2014      PT End of Session - 05/05/14 1453    Visit Number 8   Number of Visits 18   Date for PT Re-Evaluation 06/13/14   PT Start Time 6269   PT Stop Time 1512   PT Time Calculation (min) 57 min   Activity Tolerance Patient tolerated treatment well      Past Medical History  Diagnosis Date  . Diabetes mellitus without complication   . Hypertension   . Cancer     Past Surgical History  Procedure Laterality Date  . Colon surgery    . Rotator cuff repair      There were no vitals taken for this visit.  Visit Diagnosis:  Pain in joint, shoulder region, right  Stiffness of neck      Subjective Assessment - 05/05/14 1420    Symptoms constant soreness, sleeps 3 hours in a row.  4-5/10 with home activities, reach and grabbing painfu   Currently in Pain? Yes   Pain Score 5    Pain Location Neck   Pain Orientation Right;Left   Pain Radiating Towards arms  RT>Lt     Pain Frequency Constant   Aggravating Factors  reaching, grabbing,  sitting at computer long    Pain Relieving Factors medication   Effect of Pain on Daily Activities limits activities, interrupts sleep   Multiple Pain Sites No                    [facility] Adult PT Treatment/Exercise - 05/05/14 1431    Posture/Postural Control   Posture Comments pillow roll, work site posture ed.  Practiced.   Shoulder Exercises: Pulleys   Flexion --  5 minutes   Moist Heat Therapy   Number Minutes Moist Heat 15 Minutes   Moist Heat Location --  Neck   Electrical Stimulation   Electrical Stimulation Location upper trap Rt   Electrical Stimulation Action IFC   Electrical Stimulation  Parameters 9   Electrical Stimulation Goals Pain   Manual Therapy   Manual Therapy Other (comment)  taping to inhibit upper traps. to increase comfort with rota                PT Education - 05/05/14 1453    Education provided Yes   Education Details sleeping pillow, towel roll,computer posture   Person(s) Educated Patient   Methods Explanation;Demonstration;Handout   Comprehension Verbalized understanding;Returned demonstration          PT Short Term Goals - 05/05/14 1456    PT SHORT TERM GOAL #2   Title He will report decr pain by 40-50% in neck and shoulder.    Time 4   Period Weeks   Status Achieved           PT Long Term Goals - 05/05/14 1457    PT LONG TERM GOAL #3   Title He will report able to reach and grasp with min to no pain.    Time 8   Period Weeks   Status On-going   PT LONG TERM GOAL #4   Title He will report self care and home tasks with 1-2 max pain in shoulder and neck   Time 8  Status On-going               Plan - 05/05/14 1454    Clinical Impression Statement 2 more visits.  40% improved pain, 5/10 pain with home tasks, grasping painful, uses Lt more,    PT Next Visit Plan cont manual, exercise as per previous session and add stretche as able, traction if helpful, Add band exercise or cervical stab exercises next visit,    GOAL ASSESSMENT        Problem List Patient Active Problem List   Diagnosis Date Noted  . FLATULENCE-GAS-BLOATING 11/14/2009  . PERSONAL HX COLON CANCER 11/14/2009   [name], PTA 05/05/2014 3:14 PM Phone: [phone] Fax: [phone]  [facility] 05/05/2014, 3:14 PM  [place] [facility] [address] [place], [place], [address] Phone: [phone]   Fax:  [phone]

## 2014-05-06 ENCOUNTER — Ambulatory Visit: Payer: No Typology Code available for payment source | Admitting: Physical Therapy

## 2014-05-06 DIAGNOSIS — M25511 Pain in right shoulder: Secondary | ICD-10-CM

## 2014-05-06 DIAGNOSIS — M5030 Other cervical disc degeneration, unspecified cervical region: Secondary | ICD-10-CM

## 2014-05-06 DIAGNOSIS — M25611 Stiffness of right shoulder, not elsewhere classified: Secondary | ICD-10-CM

## 2014-05-06 NOTE — Therapy (Signed)
[place], [place], [address] Phone: [phone]   Fax:  [phone]  Physical Therapy Treatment  Patient Details  Name: Lance Crosby MRN: [id number] Date of Birth: 1945/12/26 Referring Provider:  Garald Balding, MD  Encounter Date: 05/06/2014      PT End of Session - 05/06/14 1732    Visit Number 9   Number of Visits 18   Date for PT Re-Evaluation 06/13/14   PT Start Time 6962   PT Stop Time 1738   PT Time Calculation (min) 63 min   Activity Tolerance Patient tolerated treatment well      Past Medical History  Diagnosis Date  . Diabetes mellitus without complication   . Hypertension   . Cancer     Past Surgical History  Procedure Laterality Date  . Colon surgery    . Rotator cuff repair      There were no vitals taken for this visit.  Visit Diagnosis:  Pain in joint, shoulder region, right  Stiffness of shoulder joint, right  DDD (degenerative disc disease), cervical      Subjective Assessment - 05/06/14 1651    Symptoms Tape helped a lot.  Tried pillow roll and slept better,  Slept a little longer.   Currently in Pain? Yes   Pain Score 4    Pain Location Neck   Pain Orientation Right;Left   Pain Descriptors / Indicators Throbbing;Aching   Pain Radiating Towards In to shoulder Rt>Lt   Pain Frequency Constant   Aggravating Factors  Same as previous   Pain Relieving Factors Medication   Effect of Pain on Daily Activities limits itting   Multiple Pain Sites No                    [facility] Adult PT Treatment/Exercise - 05/06/14 1645    Neck Exercises: Seated   Postural Training Neck retraction practiced with cues to decrease strain on upper traps.   Shoulder Exercises: Seated   Theraband Level (Shoulder Elevation) Level 4 (Blue)  10 reps   Protraction 10 reps;Left;Right  yellow band, serratus punch   Horizontal ABduction 10 reps  yellow band   Theraband  Level (Shoulder External Rotation) Level 1 (Yellow)  10 reps   Other Seated Exercises scapular depression using blue band at elbow 10 reps   Shoulder Exercises: Standing   Other Standing Exercises Pushups at wall   Shoulder Exercises: Pulleys   Flexion --  5 minutes   Manual Therapy   Manual Therapy --  Taping to inhibit supraspinatus and deltoid, biceps RT                 PT Education - 05/06/14 1731    Education provided Yes   Education Details Wall pushups and band resisted scapular depression added to home exercise.  Hand written exercise instructions given.   Person(s) Educated Patient   Methods Explanation;Demonstration;Handout          PT Short Term Goals - 05/05/14 1456    PT SHORT TERM GOAL #2   Title He will report decr pain by 40-50% in neck and shoulder.    Time 4   Period Weeks   Status Achieved           PT Long Term Goals - 05/05/14 1457    PT LONG TERM GOAL #3   Title He will report able to reach and grasp with min to no pain.    Time  8   Period Weeks   Status On-going   PT LONG TERM GOAL #4   Title He will report self care and home tasks with 1-2 max pain in shoulder and neck   Time 8   Status On-going               Plan - 05/06/14 1733    Clinical Impression Statement 1 more visit able to progress home exercise, motivate patient to work patiently to attain results.   PT Next Visit Plan Discharge visit next. answer any questions, measure strength and motion.        Problem List Patient Active Problem List   Diagnosis Date Noted  . FLATULENCE-GAS-BLOATING 11/14/2009  . PERSONAL HX COLON CANCER 11/14/2009   Lance Crosby, PTA 05/06/2014 5:45 PM Phone: [phone] Fax: [phone]   [facility] 05/06/2014, 5:45 PM  [place], [place], [address] Phone: [phone]   Fax:  [phone]

## 2014-05-19 ENCOUNTER — Ambulatory Visit: Payer: Medicare Other | Attending: Orthopaedic Surgery | Admitting: Rehabilitation

## 2014-05-19 DIAGNOSIS — M25511 Pain in right shoulder: Secondary | ICD-10-CM | POA: Diagnosis present

## 2014-05-19 DIAGNOSIS — M436 Torticollis: Secondary | ICD-10-CM | POA: Insufficient documentation

## 2014-05-19 DIAGNOSIS — M5030 Other cervical disc degeneration, unspecified cervical region: Secondary | ICD-10-CM | POA: Insufficient documentation

## 2014-05-19 DIAGNOSIS — M25611 Stiffness of right shoulder, not elsewhere classified: Secondary | ICD-10-CM | POA: Diagnosis not present

## 2014-05-19 DIAGNOSIS — M542 Cervicalgia: Secondary | ICD-10-CM | POA: Insufficient documentation

## 2014-05-19 NOTE — Therapy (Addendum)
[place], [place], [address] Phone: [phone]   Fax:  [phone]  Physical Therapy Treatment  Patient Details  Name: Lance Crosby MRN: [id number] Date of Birth: [birth year] Referring Provider:  Garald Balding, MD  Encounter Date: 05/19/2014      PT End of Session - 05/19/14 1621    Visit Number 10   Number of Visits 18   Date for PT Re-Evaluation 06/13/14   PT Start Time 3151   PT Stop Time 1115   PT Time Calculation (min) 60 min      Past Medical History  Diagnosis Date  . Diabetes mellitus without complication   . Hypertension   . Cancer     Past Surgical History  Procedure Laterality Date  . Colon surgery    . Rotator cuff repair      There were no vitals taken for this visit.  Visit Diagnosis:  Pain in joint, shoulder region, right  Stiffness of shoulder joint, right  DDD (degenerative disc disease), cervical  Stiffness of neck  Cervicalgia        [facility] PT Assessment - 05/19/14 1039    AROM   Right Shoulder Flexion 137 Degrees   Right Shoulder ABduction 115 Degrees   Right Shoulder Internal Rotation --  reach to sacrum   Right Shoulder External Rotation --  reach to T1   Left Shoulder Flexion 162 Degrees   Left Shoulder ABduction 170 Degrees   Left Shoulder Internal Rotation --  reach to lower thoracic   Left Shoulder External Rotation --  reach to T3   Cervical - Right Rotation 55   Cervical - Left Rotation 70   Strength   Right Shoulder Flexion 4-/5   Right Shoulder ABduction 3+/5   Right Shoulder Internal Rotation 4/5   Right Shoulder External Rotation 4-/5                  [facility] Adult PT Treatment/Exercise - 05/19/14 0001    Moist Heat Therapy   Number Minutes Moist Heat 15 Minutes   Moist Heat Location Other (comment)  neck   Electrical Stimulation   Electrical Stimulation Location upper trap and distal bicep   Electrical Stimulation  Action Pre Mod   Electrical Stimulation Parameters to tolerance each channel   Electrical Stimulation Goals Pain   Manual Therapy   Manual Therapy Massage   Massage Using edging tool to release myofascial adhesions in right upper trap, supraspinatus and bicep                PT Education - 05/19/14 1620    Education provided Yes   Education Details Info On TENS and where to [facility]) Educated Patient   Methods Explanation   Comprehension Verbalized understanding          PT Short Term Goals - 05/19/14 1047    PT SHORT TERM GOAL #1   Title Independent with intial HEP   Time 4   Period Weeks   Status Achieved   PT SHORT TERM GOAL #2   Title He will report decr pain by 40-50% in neck and shoulder.    Time 4   Period Weeks   Status On-going  15% per patient   PT SHORT TERM GOAL #3   Title He will improve cervical rotation to 60 degredes bilaterally   Time 4   Period Weeks   Status Partially Met   PT SHORT TERM  GOAL #4   Title He will improve active Rt shoulder elevation to 130 degrees   Time 4   Period Weeks   Status Achieved           PT Long Term Goals - 05/19/14 1048    PT LONG TERM GOAL #1   Title He will be able to do HEP issued as of last visit   Time 8   Period Weeks   Status Achieved   PT LONG TERM GOAL #2   Title He will report pain decreased 80% or more in neck and shoulder   Time 8   Period Weeks   Status On-going   PT LONG TERM GOAL #3   Title He will report able to reach and grasp with min to no pain.    Time 8   Period Weeks   Status On-going   PT LONG TERM GOAL #4   Time 8   Period Weeks   Status On-going   PT LONG TERM GOAL #5   Title He will report no symptoms into Rt hand   Time 8   Period Weeks   Status On-going               Plan - 05/19/14 1625    Clinical Impression Statement Pt reports 15 % improvement since beginning P.T. Minimal progress toward goals.  He would like to avoid surgery if possible.     PT Next Visit Plan Pt would like to see MD and will call back to schedule if MD would like him to continue.         Problem List Patient Active Problem List   Diagnosis Date Noted  . FLATULENCE-GAS-BLOATING 11/14/2009  . PERSONAL HX COLON CANCER 11/14/2009    Dorene Ar, PTA 05/19/2014, 4:27 PM  [facility] [address] [place], [place], [address] Phone: [phone]   Fax:  [phone]     PHYSICAL THERAPY DISCHARGE SUMMARY  Visits from Start of Care: 10  Current functional level related to goals / functional outcomes: Unknown as he did not return after above visit 05/19/14. See above for goal achievement.    Remaining deficits: Decreased range and pain cont to limit as of 05/19/14 visit   Education / Equipment: HEP  Plan:                                                    Patient goals were partially met. Patient is being discharged due to not returning since the last visit.  ?????    Lillette Boxer Chasse PT 06/16/14 8:21 AM

## 2014-08-17 ENCOUNTER — Other Ambulatory Visit: Payer: Self-pay | Admitting: Orthopaedic Surgery

## 2014-08-17 DIAGNOSIS — M25511 Pain in right shoulder: Secondary | ICD-10-CM

## 2014-09-15 ENCOUNTER — Ambulatory Visit
Admission: RE | Admit: 2014-09-15 | Discharge: 2014-09-15 | Disposition: A | Discharge status: Home / Self Care | Payer: Medicare Other | Source: Ambulatory Visit | Attending: Orthopaedic Surgery | Admitting: Orthopaedic Surgery

## 2014-09-15 DIAGNOSIS — M25511 Pain in right shoulder: Secondary | ICD-10-CM

## 2014-11-03 ENCOUNTER — Encounter: Payer: Self-pay | Admitting: Internal Medicine

## 2014-11-15 ENCOUNTER — Encounter: Payer: Self-pay | Admitting: Internal Medicine

## 2014-11-16 ENCOUNTER — Telehealth: Payer: Self-pay | Admitting: Internal Medicine

## 2014-11-16 NOTE — Telephone Encounter (Signed)
Received records from [facility] forwarded 8 pages to Dr. Scarlette Shorts 11/16/14 fbg.

## 2014-11-23 ENCOUNTER — Encounter (HOSPITAL_COMMUNITY): Payer: Self-pay | Admitting: Emergency Medicine

## 2014-11-23 ENCOUNTER — Emergency Department (HOSPITAL_COMMUNITY)
Admission: EM | Admit: 2014-11-23 | Discharge: 2014-11-23 | Disposition: A | Discharge status: Home / Self Care | Payer: Medicare Other | Attending: Emergency Medicine | Admitting: Emergency Medicine

## 2014-11-23 DIAGNOSIS — Y998 Other external cause status: Secondary | ICD-10-CM | POA: Diagnosis not present

## 2014-11-23 DIAGNOSIS — Z859 Personal history of malignant neoplasm, unspecified: Secondary | ICD-10-CM | POA: Insufficient documentation

## 2014-11-23 DIAGNOSIS — W57XXXA Bitten or stung by nonvenomous insect and other nonvenomous arthropods, initial encounter: Secondary | ICD-10-CM | POA: Insufficient documentation

## 2014-11-23 DIAGNOSIS — Y9389 Activity, other specified: Secondary | ICD-10-CM | POA: Insufficient documentation

## 2014-11-23 DIAGNOSIS — E119 Type 2 diabetes mellitus without complications: Secondary | ICD-10-CM | POA: Insufficient documentation

## 2014-11-23 DIAGNOSIS — S70362A Insect bite (nonvenomous), left thigh, initial encounter: Secondary | ICD-10-CM | POA: Insufficient documentation

## 2014-11-23 DIAGNOSIS — I10 Essential (primary) hypertension: Secondary | ICD-10-CM | POA: Insufficient documentation

## 2014-11-23 DIAGNOSIS — Y9289 Other specified places as the place of occurrence of the external cause: Secondary | ICD-10-CM | POA: Insufficient documentation

## 2014-11-23 DIAGNOSIS — Z79899 Other long term (current) drug therapy: Secondary | ICD-10-CM | POA: Insufficient documentation

## 2014-11-23 DIAGNOSIS — D849 Immunodeficiency, unspecified: Secondary | ICD-10-CM | POA: Diagnosis not present

## 2014-11-23 DIAGNOSIS — T7840XA Allergy, unspecified, initial encounter: Secondary | ICD-10-CM

## 2014-11-23 MED ORDER — FAMOTIDINE 20 MG PO TABS: 20.0000 mg | ORAL_TABLET | Freq: Two times a day (BID) | ORAL | Status: DC

## 2014-11-23 MED ORDER — EPINEPHRINE 0.3 MG/0.3ML IJ SOAJ: 0.3000 mg | Freq: Once | INTRAMUSCULAR | Status: DC | PRN

## 2014-11-23 MED ORDER — DIPHENHYDRAMINE HCL 25 MG PO TABS: 25.0000 mg | ORAL_TABLET | Freq: Four times a day (QID) | ORAL | Status: DC

## 2014-11-23 MED ORDER — METHYLPREDNISOLONE SODIUM SUCC 125 MG IJ SOLR
125.0000 mg | Freq: Once | INTRAMUSCULAR | Status: AC
  Administered 2014-11-23: 125 mg via INTRAVENOUS
  Filled 2014-11-23: qty 2

## 2014-11-23 MED ORDER — PREDNISONE 10 MG (21) PO TBPK
10.0000 mg | ORAL_TABLET | Freq: Every day | ORAL | Status: DC
Start: 2014-11-23 — End: 2015-02-09

## 2014-11-23 MED ORDER — EPINEPHRINE 0.3 MG/0.3ML IJ SOAJ
0.3000 mg | Freq: Once | INTRAMUSCULAR | Status: AC
  Administered 2014-11-23: 0.3 mg via INTRAMUSCULAR
  Filled 2014-11-23: qty 0.3

## 2014-11-23 MED ORDER — FAMOTIDINE IN NACL 20-0.9 MG/50ML-% IV SOLN
20.0000 mg | Freq: Once | INTRAVENOUS | Status: AC
  Administered 2014-11-23: 20 mg via INTRAVENOUS
  Filled 2014-11-23: qty 50

## 2014-11-23 MED ORDER — DIPHENHYDRAMINE HCL 50 MG/ML IJ SOLN
25.0000 mg | Freq: Once | INTRAMUSCULAR | Status: AC
  Administered 2014-11-23: 25 mg via INTRAVENOUS
  Filled 2014-11-23: qty 1

## 2014-11-23 NOTE — ED Notes (Signed)
Rash/hives less visible.

## 2014-11-23 NOTE — Care Management Note (Signed)
Case Management Note  Patient Details  Name: Lance Crosby MRN: [id number] Date of Birth: Aug 20, 1945  Subjective/Objective:       69 yr old male in with insect bites Pt states his wife will be coming in to bring his bp med Pt requested a pillow and food              Action/Plan:  Spoke with RN food tray and pillow given --EPIC updated with Dr Aram Candela as pcp  Expected Discharge Date:    November 23 2014              Expected Discharge Plan:    home    Status of Service:   completed    Additional Comments:  Robbie Lis, RN 11/23/2014, 2:00 PM

## 2014-11-23 NOTE — ED Notes (Signed)
Two unsuccessful IV Leveda Anna.

## 2014-11-23 NOTE — ED Notes (Addendum)
Two unsuccessful IV attempt; Leveda Anna at bedside attempting IV.   Pt reports bite to left hand and left groin; rash/hives noted to all four extremities. Lung sounds clear.

## 2014-11-23 NOTE — ED Provider Notes (Signed)
CSN: [id number]     Arrival date & time 11/23/14  0902 History   First MD Initiated Contact with Patient 11/23/14 [phone]     Chief Complaint  Patient presents with  . Insect Bite     (Consider location/radiation/quality/duration/timing/severity/associated sxs/prior Treatment) The history is provided by the patient.     Pt with hx DM, no known allergies, p/w itching and SOB following spider bites.  Pt states he felt a bite on the back of his left hand under his glove, then notes another insect fell through a hole in his pants and stung/bit his left groin.  He saw the spider on his shirt (states there were multiple spiders) - described as nickel sized and black with some yellow on it.  Since then he has felt swelling, tightness and itching in his anterior neck through his chin.  Associated SOB.  Denies itching or swelling in his mouth or throat, difficulty swallowing.  Denies chest pain or tightness, N/V.    Past Medical History  Diagnosis Date  . Diabetes mellitus without complication   . Hypertension   . Cancer    Past Surgical History  Procedure Laterality Date  . Colon surgery    . Rotator cuff repair     No family history on file. History  Substance Use Topics  . Smoking status: Never Smoker   . Smokeless tobacco: Not on file  . Alcohol Use: No    Review of Systems  Constitutional: Negative for fever and chills.  HENT: Negative for drooling, sore throat and trouble swallowing.   Respiratory: Positive for shortness of breath. Negative for cough and chest tightness.   Cardiovascular: Negative for chest pain.  Gastrointestinal: Negative for nausea, vomiting and abdominal pain.  Musculoskeletal: Negative for myalgias and neck stiffness.  Skin: Positive for rash.  Allergic/Immunologic: Positive for immunocompromised state (diabetic).  Neurological: Negative for weakness and numbness.  Psychiatric/Behavioral: Negative for self-injury.      Allergies  Other  Home  Medications   Prior to Admission medications   Medication Sig Start Date End Date Taking? Authorizing Provider  amLODipine (NORVASC) 10 MG tablet Take 10 mg by mouth daily.   Yes Historical Provider, MD  atenolol (TENORMIN) 25 MG tablet Take 25 mg by mouth daily.   Yes Historical Provider, MD  hydrochlorothiazide (HYDRODIURIL) 25 MG tablet Take 25 mg by mouth daily.   Yes Historical Provider, MD  metFORMIN (GLUCOPHAGE) 1000 MG tablet Take 1,000 mg by mouth 2 (two) times daily with a meal.   Yes Historical Provider, MD   BP 147/93 mmHg  Pulse 77  Temp(Src) 97.5 F (36.4 C) (Oral)  Resp 17  SpO2 99% Physical Exam  Constitutional: He appears well-developed and well-nourished. No distress.  HENT:  Head: Normocephalic and atraumatic.  Mouth/Throat: Oropharynx is clear and moist. No oropharyngeal exudate.  Eyes: Conjunctivae and EOM are normal. Right eye exhibits no discharge. Left eye exhibits no discharge.  Neck: Normal range of motion. Neck supple.  Cardiovascular: Normal rate and regular rhythm.   Pulmonary/Chest: Effort normal and breath sounds normal. No stridor. No respiratory distress. He has no wheezes. He has no rales.  Lymphadenopathy:    He has no cervical adenopathy.  Neurological: He is alert.  Skin: Rash noted. He is not diaphoretic.  Uritcarial rash around neck and upper chest, also left groin.   Left dorsal hand with erythematous soft edema.  No induration or fluctuance.    Nursing note and vitals reviewed.   ED Course  Procedures (including critical care time) Labs Review Labs Reviewed - No data to display  Imaging Review No results found.   EKG Interpretation   Date/Time:  Tuesday November 23 2014 10:02:30 EDT Ventricular Rate:  66 PR Interval:  182 QRS Duration: 79 QT Interval:  417 QTC Calculation: 437 R Axis:   10 Text Interpretation:  Sinus rhythm Atrial premature complexes Borderline T  wave abnormalities Confirmed by LITTLE MD, RACHEL ([id number]) on  11/23/2014  1:15:56 PM       9:34 AM Medication orders placed and verbalized to nurse.   Pt has just reached the room.  I have asked him to change and have asked nurse to place IV promptly to start medications.    9:53 AM Pt seen again and swelling and rash has spread to include entire face.  Pt continues to c/o mild SOB.  Lungs CTAB.  Pt has just been given IV medications.  However, I am concerned given the quick progression of symptoms.  Discussed pt with Dr Rex Kras who agrees with treatment plan.  Will add epinephrine IM.  Verbalized to nurse Leron Croak.   12:09 PM Pt reports great improvement.  His facial swelling and rash have resolved.  Will continue to monitor.    3:08 PM Pt reports he feels much better.  Eating and drinking.  No facial swelling.  No SOB.    MDM   Final diagnoses:  Allergic reaction, initial encounter    Afebrile, nontoxic patient with allergic reaction to spider bite.  Pt monitored multiple hours.  Solumedrol, benadryl, pepcid, epinephrine given with great relief.  No rebound.     D/C home with prednisone, benadryl, pepcid, epi pen, allergist follow up.  Discussed strict return precautions and instructions of when to use epi pen.   Discussed result, findings, treatment, and follow up  with patient.  Pt given return precautions.  Pt verbalizes understanding and agrees with plan.        Clayton Bibles, PA-C 11/23/14 [place], MD 11/24/14 [phone]

## 2014-11-23 NOTE — ED Notes (Signed)
Per pt, he was bitten by two spiders on the left hand and in the groin area. Pt stated he thought they were black widow spiders, but did not see a red hour glass shape on either.

## 2014-11-23 NOTE — Discharge Instructions (Signed)
Read the information below.  Use the prescribed medication as directed.  Please discuss all new medications with your pharmacist.  You may return to the Emergency Department at any time for worsening condition or any new symptoms that concern you.   If you develop itching or swelling in your mouth or throat or any difficulty swallowing or breathing, call 911 or return to the Emergency Department immediately for a recheck.     Allergies Allergies may happen from anything your body is sensitive to. This may be food, medicines, pollens, chemicals, and nearly anything around you in everyday life that produces allergens. An allergen is anything that causes an allergy producing substance. Heredity is often a factor in causing these problems. This means you may have some of the same allergies as your parents. Food allergies happen in all age groups. Food allergies are some of the most severe and life threatening. Some common food allergies are cow's milk, seafood, eggs, nuts, wheat, and soybeans. SYMPTOMS   Swelling around the mouth.  An itchy red rash or hives.  Vomiting or diarrhea.  Difficulty breathing. SEVERE ALLERGIC REACTIONS ARE LIFE-THREATENING. This reaction is called anaphylaxis. It can cause the mouth and throat to swell and cause difficulty with breathing and swallowing. In severe reactions only a trace amount of food (for example, peanut oil in a salad) may cause death within seconds. Seasonal allergies occur in all age groups. These are seasonal because they usually occur during the same season every year. They may be a reaction to molds, grass pollens, or tree pollens. Other causes of problems are house dust mite allergens, pet dander, and mold spores. The symptoms often consist of nasal congestion, a runny itchy nose associated with sneezing, and tearing itchy eyes. There is often an associated itching of the mouth and ears. The problems happen when you come in contact with pollens and  other allergens. Allergens are the particles in the air that the body reacts to with an allergic reaction. This causes you to release allergic antibodies. Through a chain of events, these eventually cause you to release histamine into the blood stream. Although it is meant to be protective to the body, it is this release that causes your discomfort. This is why you were given anti-histamines to feel better. If you are unable to pinpoint the offending allergen, it may be determined by skin or blood testing. Allergies cannot be cured but can be controlled with medicine. Hay fever is a collection of all or some of the seasonal allergy problems. It may often be treated with simple over-the-counter medicine such as diphenhydramine. Take medicine as directed. Do not drink alcohol or drive while taking this medicine. Check with your caregiver or package insert for child dosages. If these medicines are not effective, there are many new medicines your caregiver can prescribe. Stronger medicine such as nasal spray, eye drops, and corticosteroids may be used if the first things you try do not work well. Other treatments such as immunotherapy or desensitizing injections can be used if all else fails. Follow up with your caregiver if problems continue. These seasonal allergies are usually not life threatening. They are generally more of a nuisance that can often be handled using medicine. HOME CARE INSTRUCTIONS   If unsure what causes a reaction, keep a diary of foods eaten and symptoms that follow. Avoid foods that cause reactions.  If hives or rash are present:  Take medicine as directed.  You may use an over-the-counter antihistamine (diphenhydramine) for  hives and itching as needed.  Apply cold compresses (cloths) to the skin or take baths in cool water. Avoid hot baths or showers. Heat will make a rash and itching worse.  If you are severely allergic:  Following a treatment for a severe reaction,  hospitalization is often required for closer follow-up.  Wear a medic-alert bracelet or necklace stating the allergy.  You and your family must learn how to give adrenaline or use an anaphylaxis kit.  If you have had a severe reaction, always carry your anaphylaxis kit or EpiPen with you. Use this medicine as directed by your caregiver if a severe reaction is occurring. Failure to do so could have a fatal outcome. SEEK MEDICAL CARE IF:  You suspect a food allergy. Symptoms generally happen within 30 minutes of eating a food.  Your symptoms have not gone away within 2 days or are getting worse.  You develop new symptoms.  You want to retest yourself or your child with a food or drink you think causes an allergic reaction. Never do this if an anaphylactic reaction to that food or drink has happened before. Only do this under the care of a caregiver. SEEK IMMEDIATE MEDICAL CARE IF:   You have difficulty breathing, are wheezing, or have a tight feeling in your chest or throat.  You have a swollen mouth, or you have hives, swelling, or itching all over your body.  You have had a severe reaction that has responded to your anaphylaxis kit or an EpiPen. These reactions may return when the medicine has worn off. These reactions should be considered life threatening. MAKE SURE YOU:   Understand these instructions.  Will watch your condition.  Will get help right away if you are not doing well or get worse. Document Released: 06/26/2002 Document Revised: 07/28/2012 Document Reviewed: 12/01/2007 [facility] Patient Information 2015 [place], [place]. This information is not intended to replace advice given to you by your health care provider. Make sure you discuss any questions you have with your health care provider.

## 2014-12-16 ENCOUNTER — Encounter: Payer: Self-pay | Admitting: Internal Medicine

## 2014-12-17 ENCOUNTER — Other Ambulatory Visit: Payer: Self-pay | Admitting: Orthopaedic Surgery

## 2014-12-21 ENCOUNTER — Other Ambulatory Visit: Payer: Self-pay | Admitting: Orthopaedic Surgery

## 2014-12-21 DIAGNOSIS — M545 Low back pain: Secondary | ICD-10-CM

## 2015-01-03 ENCOUNTER — Other Ambulatory Visit: Payer: Self-pay | Admitting: Orthopedic Surgery

## 2015-01-26 ENCOUNTER — Ambulatory Visit (AMBULATORY_SURGERY_CENTER): Payer: Self-pay | Admitting: *Deleted

## 2015-01-26 VITALS — Ht 67.0 in | Wt 195.0 lb

## 2015-01-26 DIAGNOSIS — Z85038 Personal history of other malignant neoplasm of large intestine: Secondary | ICD-10-CM

## 2015-01-26 MED ORDER — NA SULFATE-K SULFATE-MG SULF 17.5-3.13-1.6 GM/177ML PO SOLN: ORAL | Status: DC

## 2015-01-26 NOTE — Progress Notes (Signed)
Patient denies any allergies to eggs or soy. Patient denies any problems with anesthesia/sedation. Patient denies any oxygen use at home and does not take any diet/weight loss medications.  

## 2015-02-01 ENCOUNTER — Emergency Department (HOSPITAL_COMMUNITY)
Admission: EM | Admit: 2015-02-01 | Discharge: 2015-02-01 | Disposition: A | Discharge status: Home / Self Care | Payer: Medicare Other | Attending: Emergency Medicine | Admitting: Emergency Medicine

## 2015-02-01 ENCOUNTER — Encounter (HOSPITAL_COMMUNITY): Payer: Self-pay | Admitting: Nurse Practitioner

## 2015-02-01 DIAGNOSIS — Z79899 Other long term (current) drug therapy: Secondary | ICD-10-CM | POA: Diagnosis not present

## 2015-02-01 DIAGNOSIS — Z859 Personal history of malignant neoplasm, unspecified: Secondary | ICD-10-CM | POA: Insufficient documentation

## 2015-02-01 DIAGNOSIS — I10 Essential (primary) hypertension: Secondary | ICD-10-CM | POA: Diagnosis not present

## 2015-02-01 DIAGNOSIS — M62830 Muscle spasm of back: Secondary | ICD-10-CM | POA: Diagnosis not present

## 2015-02-01 DIAGNOSIS — R200 Anesthesia of skin: Secondary | ICD-10-CM | POA: Diagnosis present

## 2015-02-01 DIAGNOSIS — M5412 Radiculopathy, cervical region: Secondary | ICD-10-CM | POA: Diagnosis not present

## 2015-02-01 DIAGNOSIS — E119 Type 2 diabetes mellitus without complications: Secondary | ICD-10-CM | POA: Insufficient documentation

## 2015-02-01 LAB — BASIC METABOLIC PANEL
Anion gap: 8 (ref 5–15)
BUN: 11 mg/dL (ref 6–20)
CO2: 28 mmol/L (ref 22–32)
Calcium: 9.6 mg/dL (ref 8.9–10.3)
Chloride: 101 mmol/L (ref 101–111)
Creatinine, Ser: 0.84 mg/dL (ref 0.61–1.24)
GFR calc Af Amer: >60 mL/min (ref >60)
GFR calc non Af Amer: >60 mL/min (ref >60)
Glucose, Bld: 153 mg/dL — ABNORMAL HIGH (ref 65–99)
Potassium: 3.8 mmol/L (ref 3.5–5.1)
Sodium: 137 mmol/L (ref 135–145)

## 2015-02-01 LAB — CBC
HCT: 41.7 % (ref 39.0–52.0)
Hemoglobin: 14.9 g/dL (ref 13.0–17.0)
MCH: 29.8 pg (ref 26.0–34.0)
MCHC: 35.7 g/dL (ref 30.0–36.0)
MCV: 83.4 fL (ref 78.0–100.0)
Platelets: 273 10*3/uL (ref 150–400)
RBC: 5 MIL/uL (ref 4.22–5.81)
RDW: 14.4 % (ref 11.5–15.5)
WBC: 5.8 10*3/uL (ref 4.0–10.5)

## 2015-02-01 MED ORDER — OXYCODONE-ACETAMINOPHEN 5-325 MG PO TABS
1.0000 | ORAL_TABLET | Freq: Once | ORAL | Status: AC
Start: 2015-02-01 — End: 2015-02-01
  Administered 2015-02-01: 1 via ORAL
  Filled 2015-02-01: qty 1

## 2015-02-01 MED ORDER — OXYCODONE-ACETAMINOPHEN 5-325 MG PO TABS: 1.0000 | ORAL_TABLET | Freq: Four times a day (QID) | ORAL | Status: DC | PRN

## 2015-02-01 MED ORDER — METHOCARBAMOL 500 MG PO TABS: 500.0000 mg | ORAL_TABLET | Freq: Two times a day (BID) | ORAL | Status: DC

## 2015-02-01 NOTE — Discharge Instructions (Signed)
Return to the ED with any concerns including weakness of arms or legs, changes in vision or speech, not able to urinate, loss of control of bowel or bladder, fever/chills, decreased level of alertness/lethargy, or any other alarming symptoms

## 2015-02-01 NOTE — ED Provider Notes (Signed)
CSN: [id number]     Arrival date & time 02/01/15  1858 History   First MD Initiated Contact with Patient 02/01/15 1928     Chief Complaint  Patient presents with  . Numbness    back and Right arm  . Hypertension     (Consider location/radiation/quality/duration/timing/severity/associated sxs/prior Treatment) HPI  Pt presenting with c/o pain below right shoulder blade as well as pain in right arm with some numbness in right hand.  Pt states he has had numbness in his right arm and hand over the past 2 weeks.  He had xrays performed by ortho and was told the numbness was due to cervical narrowing.  He was given po steroids and states he has not had any change in the numbness- he finished the steroid dose yesterday.  No weakness in arms/hands, no neck pain.  For the past week he has also had pain under right shoulder blade which became worse today.  Pain is worse with palpation and with certain positions.  No new injuries.  He tried ibuprofen which did not help his pain.  No fever/chills.  He has appoointment to f/u with ortho in 2 days.  Pt is also concerned about his blood pressure being elevated.  He has been taking his BP meds as prescribed- no chest pain, no sob, no leg swelling.   There are no other associated systemic symptoms, there are no other alleviating or modifying factors.   Past Medical History  Diagnosis Date  . Diabetes mellitus without complication ([place])   . Hypertension   . Cancer [facility])    Past Surgical History  Procedure Laterality Date  . Colon surgery    . Rotator cuff repair     Family History  Problem Relation Age of Onset  . Colon cancer Neg Hx    Social History  Substance Use Topics  . Smoking status: Never Smoker   . Smokeless tobacco: Never Used  . Alcohol Use: No    Review of Systems  ROS reviewed and all otherwise negative except for mentioned in HPI    Allergies  Other  Home Medications   Prior to Admission medications   Medication Sig  Start Date End Date Taking? Authorizing Provider  amLODipine (NORVASC) 10 MG tablet Take 10 mg by mouth daily.   Yes Historical Provider, MD  atenolol (TENORMIN) 25 MG tablet Take 25 mg by mouth daily.   Yes Historical Provider, MD  hydrochlorothiazide (HYDRODIURIL) 25 MG tablet Take 25 mg by mouth daily.   Yes Historical Provider, MD  ibuprofen (ADVIL,MOTRIN) 200 MG tablet Take 400 mg by mouth every 6 (six) hours as needed for moderate pain.   Yes Historical Provider, MD  metFORMIN (GLUCOPHAGE) 1000 MG tablet Take 1,000 mg by mouth 2 (two) times daily with a meal.   Yes Historical Provider, MD  diphenhydrAMINE (BENADRYL) 25 MG tablet Take 1 tablet (25 mg total) by mouth every 6 (six) hours. X 3 days then PRN itching, allergic reaction Patient not taking: Reported on 01/26/2015 11/23/14   Clayton Bibles, PA-C  EPINEPHrine 0.3 mg/0.3 mL IJ SOAJ injection Inject 0.3 mLs (0.3 mg total) into the muscle once as needed (severe allergic reaction). Patient not taking: Reported on 01/26/2015 11/23/14   Clayton Bibles, PA-C  famotidine (PEPCID) 20 MG tablet Take 1 tablet (20 mg total) by mouth 2 (two) times daily. X 3 days than PRN allergic reaction Patient not taking: Reported on 01/26/2015 11/23/14   Clayton Bibles, PA-C  methocarbamol (ROBAXIN) 500  MG tablet Take 1 tablet (500 mg total) by mouth 2 (two) times daily. 02/01/15   Alfonzo Beers, MD  methylPREDNISolone (MEDROL DOSEPAK) 4 MG TBPK tablet take as directed for 6 days 01/25/15   Historical Provider, MD  Na Sulfate-K Sulfate-Mg Sulf SOLN Suprep (no substitutions)-TAKE AS DIRECTED. Patient not taking: Reported on 02/01/2015 01/26/15   Irene Shipper, MD  oxyCODONE-acetaminophen (PERCOCET/ROXICET) 5-325 MG tablet Take 1-2 tablets by mouth every 6 (six) hours as needed for severe pain. 02/01/15   Alfonzo Beers, MD  predniSONE (STERAPRED UNI-PAK 21 TAB) 10 MG (21) TBPK tablet Take 1 tablet (10 mg total) by mouth daily. Day 1: take 6 tabs.  Day 2: 5 tabs  Day 3: 4 tabs  Day  4: 3 tabs  Day 5: 2 tabs  Day 6: 1 tab 11/23/14   Clayton Bibles, PA-C   BP 150/90 mmHg  Pulse 54  Temp(Src) 98.1 F (36.7 C) (Oral)  Resp 16  SpO2 99%  Vitals reviewed Physical Exam  Physical Examination: General appearance - alert, well appearing, and in no distress Mental status - alert, oriented to person, place, and time Eyes - no conjunctival injection, no scleral icterus Mouth - mucous membranes moist, pharynx normal without lesions Chest - clear to auscultation, no wheezes, rales or rhonchi, symmetric air entry Heart - normal rate, regular rhythm, normal S1, S2, no murmurs, rubs, clicks or gallops Abdomen - soft, nontender, nondistended, no masses or organomegaly Neurological - alert, oriented, normal speech, strength 5/5 in extremities x 4, sensation intact to light touch- subjective decreased sensation over palm of right hand and forearm Musculoskeletal - no joint tenderness, deformity or swelling Extremities - peripheral pulses normal, no pedal edema, no clubbing or cyanosis Skin - normal coloration and turgor, no rashes  ED Course  Procedures (including critical care time) Labs Review Labs Reviewed  BASIC METABOLIC PANEL - Abnormal; Notable for the following:    Glucose, Bld 153 (*)    All other components within normal limits  CBC    Imaging Review No results found. I have personally reviewed and evaluated these images and lab results as part of my medical decision-making.   EKG Interpretation   Date/Time:  Tuesday February 01 2015 20:16:44 EDT Ventricular Rate:  54 PR Interval:  195 QRS Duration: 82 QT Interval:  419 QTC Calculation: 397 R Axis:   22 Text Interpretation:  Sinus rhythm Abnormal R-wave progression, early  transition Nonspecific T abnormalities, lateral leads No significant  change since last tracing Confirmed by [facility]  MD, Joci Dress [id number]) on  02/01/2015 8:30:28 PM      MDM   Final diagnoses:  Cervical radiculopathy  Muscle spasm of back   hypertension  Pt presenting with c/o pain below right shoulder blade and ongoing pain and numbness down right arm to hand.  On exam pt has muscle spasm under shoulder blade.  No weakness of arms.  Workup reveals no end organ damage from hypertension.  Pt has had improvement in pain with percocet in the ED. He has appointment in 2 days with ortho and states he was told he would have MRI at that time if symptoms had not improved.  Suspect cervical radiculopathy and accompanying muscle spasm.  Pt given rx for trial of muscle relaxer and pain meds.  Discharged with strict return precautions.  Pt agreeable with plan.    Alfonzo Beers, MD 02/01/15 [phone]

## 2015-02-01 NOTE — ED Notes (Addendum)
Pt c/o of cervical spine numbness with unilateral pain on the right arm, states he has been treated with steroids recently but today symptoms got worse. He also states he is unable to keep BP under control, denies any other symptoms.

## 2015-02-09 ENCOUNTER — Ambulatory Visit (AMBULATORY_SURGERY_CENTER): Payer: Medicare Other | Admitting: Internal Medicine

## 2015-02-09 ENCOUNTER — Encounter: Payer: Self-pay | Admitting: Internal Medicine

## 2015-02-09 VITALS — BP 140/94 | HR 77 | Temp 97.1°F | Resp 14 | Ht 67.0 in | Wt 195.0 lb

## 2015-02-09 DIAGNOSIS — Z1211 Encounter for screening for malignant neoplasm of colon: Secondary | ICD-10-CM | POA: Diagnosis not present

## 2015-02-09 DIAGNOSIS — D122 Benign neoplasm of ascending colon: Secondary | ICD-10-CM | POA: Diagnosis not present

## 2015-02-09 DIAGNOSIS — Z85038 Personal history of other malignant neoplasm of large intestine: Secondary | ICD-10-CM | POA: Diagnosis not present

## 2015-02-09 LAB — GLUCOSE, CAPILLARY
Glucose-Capillary: 147 mg/dL — ABNORMAL HIGH (ref 65–99)
Glucose-Capillary: 115 mg/dL — ABNORMAL HIGH (ref 65–99)

## 2015-02-09 MED ORDER — SODIUM CHLORIDE 0.9 % IV SOLN: 500.0000 mL | INTRAVENOUS | Status: DC

## 2015-02-09 NOTE — Progress Notes (Signed)
Called to room to assist during endoscopic procedure.  Patient ID and intended procedure confirmed with present staff. Received instructions for my participation in the procedure from the performing physician.  

## 2015-02-09 NOTE — Patient Instructions (Signed)
YOU HAD AN ENDOSCOPIC PROCEDURE TODAY AT [facility]:   Refer to the procedure report that was given to you for any specific questions about what was found during the examination.  If the procedure report does not answer your questions, please call your gastroenterologist to clarify.  If you requested that your care partner not be given the details of your procedure findings, then the procedure report has been included in a sealed envelope for you to review at your convenience later.  YOU SHOULD EXPECT: Some feelings of bloating in the abdomen. Passage of more gas than usual.  Walking can help get rid of the air that was put into your GI tract during the procedure and reduce the bloating. If you had a lower endoscopy (such as a colonoscopy or flexible sigmoidoscopy) you may notice spotting of blood in your stool or on the toilet paper. If you underwent a bowel prep for your procedure, you may not have a normal bowel movement for a few days.  Please Note:  You might notice some irritation and congestion in your nose or some drainage.  This is from the oxygen used during your procedure.  There is no need for concern and it should clear up in a day or so.  SYMPTOMS TO REPORT IMMEDIATELY:   Following lower endoscopy (colonoscopy or flexible sigmoidoscopy):  Excessive amounts of blood in the stool  Significant tenderness or worsening of abdominal pains  Swelling of the abdomen that is new, acute  Fever of 100F or higher   For urgent or emergent issues, a gastroenterologist can be reached at any hour by calling [phone].   DIET: Your first meal following the procedure should be a small meal and then it is ok to progress to your normal diet. Heavy or fried foods are harder to digest and may make you feel nauseous or bloated.  Likewise, meals heavy in dairy and vegetables can increase bloating.  Drink plenty of fluids but you should avoid alcoholic beverages for 24  hours.  ACTIVITY:  You should plan to take it easy for the rest of today and you should NOT DRIVE or use heavy machinery until tomorrow (because of the sedation medicines used during the test).    FOLLOW UP: Our staff will call the number listed on your records the next business day following your procedure to check on you and address any questions or concerns that you may have regarding the information given to you following your procedure. If we do not reach you, we will leave a message.  However, if you are feeling well and you are not experiencing any problems, there is no need to return our call.  We will assume that you have returned to your regular daily activities without incident.  If any biopsies were taken you will be contacted by phone or by letter within the next 1-3 weeks.  Please call us at [phone] if you have not heard about the biopsies in 3 weeks.    SIGNATURES/CONFIDENTIALITY: You and/or your care partner have signed paperwork which will be entered into your electronic medical record.  These signatures attest to the fact that that the information above on your After Visit Summary has been reviewed and is understood.  Full responsibility of the confidentiality of this discharge information lies with you and/or your care-partner.  Polyp information given.  Next colonoscopy 5 years-2021

## 2015-02-09 NOTE — Op Note (Signed)
[place]  [address]. [place], [address]   COLONOSCOPY PROCEDURE REPORT  PATIENT: Lance Crosby, Lance Crosby  MR#: [id number] BIRTHDATE: 07/09/1945 , 68  yrs. old GENDER: male ENDOSCOPIST: Eustace Quail, MD REFERRED NM:[id number] Program Recall PROCEDURE DATE:  02/09/2015 PROCEDURE:   Colonoscopy, surveillance and Colonoscopy with snare polypectomy x 2 First Screening Colonoscopy - Avg.  risk and is 50 yrs.  old or older - No.  Prior Negative Screening - Now for repeat screening. N/A  History of Adenoma - Now for follow-up colonoscopy & has been > or = to 3 yrs.  Yes hx of adenoma.  Has been 3 or more years since last colonoscopy.  Polyps removed today? 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 . Malignant sigmoid colon polyp with sigmoid resection 2000. Several follow-up examinations. Most recently August 2011 without polyps MEDICATIONS: Monitored anesthesia care and Propofol 200 mg IV  DESCRIPTION OF PROCEDURE:   After the risks benefits and alternatives of the procedure were thoroughly explained, informed consent was obtained.  The digital rectal exam revealed no abnormalities of the rectum.   The LB [id number]  endoscope was introduced through the anus and advanced to the cecum, which was identified by both the appendix and ileocecal valve. No adverse events experienced.   The quality of the prep was excellent. (Suprep was used)  The instrument was then slowly withdrawn as the colon was fully examined. Estimated blood loss is zero unless otherwise noted in this procedure report.   COLON FINDINGS: Two polyps measuring 3 mm in size were found in the ascending colon.  A polypectomy was performed with a cold snare. The resection was complete, the polyp tissue was completely retrieved and sent to histology.   There was evidence of a prior colo-colonic surgical anastomosis in  the sigmoid colon.   The examination was otherwise normal.  Retroflexed views revealed internal hemorrhoids. The time to cecum = 2.5 Withdrawal time = 8.7 The scope was withdrawn and the procedure completed. COMPLICATIONS: There were no immediate complications.  ENDOSCOPIC IMPRESSION: 1.   Two polyps were found in the ascending colon; polypectomy was performed with a cold snare 2.   There was evidence of a prior colo-colonic surgical anastomosis in the sigmoid colon 3.   The examination was otherwise normal  RECOMMENDATIONS: 1. Follow up colonoscopy in 5 years  eSigned:  Eustace Quail, MD 02/09/2015 10:46 AM   cc: The Patient and Kristie Cowman, MD

## 2015-02-09 NOTE — Progress Notes (Signed)
  [facility] Post-op Note  Patient: Lance Crosby  Procedure(s) Performed: colonoscopy  Patient Location: [facility] - Recovery Area  Anesthesia Type: Deep Sedation/Propofol  Level of Consciousness: awake, oriented and patient cooperative  Airway and Oxygen Therapy: Patient Spontanous Breathing  Post-op Pain: none  Post-op Assessment:  Post-op Vital signs reviewed, Patient's Cardiovascular Status Stable, Respiratory Function Stable, Patent Airway, No signs of Nausea or vomiting and Pain level controlled  Post-op Vital Signs: Reviewed and stable  Complications: No apparent anesthesia complications  Leronda Lewers E 10:47 AM

## 2015-02-10 ENCOUNTER — Telehealth: Payer: Self-pay | Admitting: *Deleted

## 2015-02-10 NOTE — Telephone Encounter (Signed)
  Follow up Call-  Call back number 02/09/2015  Post procedure Call Back phone  # [phone]  Permission to leave phone message Yes     Patient questions:  Do you have a fever, pain , or abdominal swelling? No. Pain Score  0 *  Have you tolerated food without any problems? Yes.    Have you been able to return to your normal activities? Yes.    Do you have any questions about your discharge instructions: Diet   No. Medications  No. Follow up visit  No.  Do you have questions or concerns about your Care? No.  Actions: * If pain score is 4 or above: No action needed, pain <4.

## 2015-02-14 ENCOUNTER — Encounter: Payer: Self-pay | Admitting: Internal Medicine

## 2015-02-19 ENCOUNTER — Other Ambulatory Visit: Payer: Self-pay | Admitting: Orthopaedic Surgery

## 2015-02-19 DIAGNOSIS — M542 Cervicalgia: Secondary | ICD-10-CM

## 2015-02-25 ENCOUNTER — Ambulatory Visit
Admission: RE | Admit: 2015-02-25 | Discharge: 2015-02-25 | Disposition: A | Discharge status: Home / Self Care | Payer: Medicare Other | Source: Ambulatory Visit | Attending: Orthopaedic Surgery | Admitting: Orthopaedic Surgery

## 2015-02-25 DIAGNOSIS — M542 Cervicalgia: Secondary | ICD-10-CM

## 2015-03-03 ENCOUNTER — Other Ambulatory Visit: Payer: Self-pay | Admitting: Orthopedic Surgery

## 2015-03-03 DIAGNOSIS — M5020 Other cervical disc displacement, unspecified cervical region: Secondary | ICD-10-CM
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
## 2019-02-15 ENCOUNTER — Emergency Department (HOSPITAL_COMMUNITY)
Admission: EM | Admit: 2019-02-15 | Discharge: 2019-02-15 | Disposition: A | Discharge status: Home / Self Care | Payer: Medicare Other | Attending: Emergency Medicine | Admitting: Emergency Medicine

## 2019-02-15 ENCOUNTER — Encounter (HOSPITAL_COMMUNITY): Payer: Self-pay

## 2019-02-15 ENCOUNTER — Other Ambulatory Visit: Payer: Self-pay

## 2019-02-15 DIAGNOSIS — Z5321 Procedure and treatment not carried out due to patient leaving prior to being seen by health care provider: Secondary | ICD-10-CM | POA: Diagnosis not present

## 2019-02-15 DIAGNOSIS — R1110 Vomiting, unspecified: Secondary | ICD-10-CM | POA: Diagnosis present

## 2019-02-15 DIAGNOSIS — R1013 Epigastric pain: Secondary | ICD-10-CM | POA: Diagnosis not present

## 2019-02-15 LAB — COMPREHENSIVE METABOLIC PANEL
ALT: 25 U/L (ref 0–44)
AST: 24 U/L (ref 15–41)
Albumin: 4.6 g/dL (ref 3.5–5.0)
Alkaline Phosphatase: 53 U/L (ref 38–126)
Anion gap: 11 (ref 5–15)
BUN: 19 mg/dL (ref 8–23)
CO2: 19 mmol/L — ABNORMAL LOW (ref 22–32)
Calcium: 9.7 mg/dL (ref 8.9–10.3)
Chloride: 107 mmol/L (ref 98–111)
Creatinine, Ser: 0.97 mg/dL (ref 0.61–1.24)
GFR calc Af Amer: >60 mL/min (ref >60)
GFR calc non Af Amer: >60 mL/min (ref >60)
Glucose, Bld: 115 mg/dL — ABNORMAL HIGH (ref 70–99)
Potassium: 4.1 mmol/L (ref 3.5–5.1)
Sodium: 137 mmol/L (ref 135–145)
Total Bilirubin: 0.8 mg/dL (ref 0.3–1.2)
Total Protein: 8.1 g/dL (ref 6.5–8.1)

## 2019-02-15 LAB — CBC
HCT: 45.1 % (ref 39.0–52.0)
Hemoglobin: 15 g/dL (ref 13.0–17.0)
MCH: 29.5 pg (ref 26.0–34.0)
MCHC: 33.3 g/dL (ref 30.0–36.0)
MCV: 88.8 fL (ref 80.0–100.0)
Platelets: 301 10*3/uL (ref 150–400)
RBC: 5.08 MIL/uL (ref 4.22–5.81)
RDW: 14.3 % (ref 11.5–15.5)
WBC: 5 10*3/uL (ref 4.0–10.5)
nRBC: 0 % (ref 0.0–0.2)

## 2019-02-15 LAB — LIPASE, BLOOD: Lipase: 34 U/L (ref 11–51)

## 2019-02-15 MED ORDER — SODIUM CHLORIDE 0.9% FLUSH: 3.0000 mL | Freq: Once | INTRAVENOUS | Status: DC

## 2019-02-15 NOTE — ED Triage Notes (Signed)
Pt states that he is having emesis and is concerned for food poisoning. Pt states he and his wife may have had bad crabcakes yesterday. Epigastric pain.

## 2019-04-22 ENCOUNTER — Encounter: Payer: Self-pay | Admitting: Orthopaedic Surgery

## 2019-04-22 ENCOUNTER — Ambulatory Visit (INDEPENDENT_AMBULATORY_CARE_PROVIDER_SITE_OTHER): Payer: Medicare PPO | Admitting: Orthopaedic Surgery

## 2019-04-22 ENCOUNTER — Other Ambulatory Visit: Payer: Self-pay

## 2019-04-22 ENCOUNTER — Ambulatory Visit (INDEPENDENT_AMBULATORY_CARE_PROVIDER_SITE_OTHER): Payer: Medicare PPO

## 2019-04-22 VITALS — Ht 66.5 in | Wt 190.0 lb

## 2019-04-22 DIAGNOSIS — M25562 Pain in left knee: Secondary | ICD-10-CM | POA: Diagnosis not present

## 2019-04-22 DIAGNOSIS — G8929 Other chronic pain: Secondary | ICD-10-CM

## 2019-04-22 DIAGNOSIS — M25512 Pain in left shoulder: Secondary | ICD-10-CM

## 2019-04-22 DIAGNOSIS — M170 Bilateral primary osteoarthritis of knee: Secondary | ICD-10-CM | POA: Diagnosis not present

## 2019-04-22 MED ORDER — METHYLPREDNISOLONE ACETATE 40 MG/ML IJ SUSP
80.0000 mg | INTRAMUSCULAR | Status: AC | PRN
  Administered 2019-04-22: 80 mg via INTRA_ARTICULAR

## 2019-04-22 MED ORDER — LIDOCAINE HCL 2 % IJ SOLN
2.0000 mL | INTRAMUSCULAR | Status: AC | PRN
  Administered 2019-04-22: 2 mL

## 2019-04-22 MED ORDER — BUPIVACAINE HCL 0.5 % IJ SOLN
2.0000 mL | INTRAMUSCULAR | Status: AC | PRN
  Administered 2019-04-22: 2 mL via INTRA_ARTICULAR

## 2019-04-22 NOTE — Progress Notes (Signed)
Office Visit Note   Patient: Lance Crosby           Date of Birth: [birth year]           MRN: [id number] Visit Date: 04/22/2019              Requested by: Kristie Cowman, MD [address] [place],  [place] [address] PCP: Kristie Cowman, MD   Assessment & Plan: Visit Diagnoses:  1. Left shoulder pain, unspecified chronicity   2. Left knee pain, unspecified chronicity   3. Bilateral primary osteoarthritis of knee   4. Chronic left shoulder pain     Plan: Left shoulder pain could possibly be early rotator cuff arthropathy with slight superior migration of the humeral head.  Will inject the subacromial space with cortisone and monitor response.  Consider MRI scan.  End-stage osteoarthritis left knee.  Long discussion over an hour with Lance Crosby reviewing the films and treatment options.  He would like to proceed with knee replacement.  We will give him a clearance form and schedule surgery thereafter.  Long discussion regarding the surgery including hospitalization, anesthesia what he can expect postoperatively.  Follow-Up Instructions: Return We will schedule left total knee replacement after medical clearance.   Orders:  Orders Placed This Encounter  Procedures  . Large Joint Inj: L subacromial bursa  . XR Shoulder Left  . XR KNEE 3 VIEW LEFT   No orders of the defined types were placed in this encounter.     Procedures: Large Joint Inj: L subacromial bursa on 04/22/2019 11:10 AM Indications: pain and diagnostic evaluation Details: 25 G 1.5 in needle, anterolateral approach  Arthrogram: No  Medications: 2 mL lidocaine 2 %; 2 mL bupivacaine 0.5 %; 80 mg methylPREDNISolone acetate 40 MG/ML Consent was given by the patient. Immediately prior to procedure a time out was called to verify the correct patient, procedure, equipment, support staff and site/side marked as required. Patient was prepped and draped in the usual sterile fashion.       Clinical Data: No additional  findings.   Subjective: Chief Complaint  Patient presents with  . Left Shoulder - Pain  . Left Knee - Pain  Patient states to have had surgery on right shoulder by Dr. Durward Fortes for rotator cuff repair several years ago.  Doing well without any problems.  Left shoulder pain about 2 months, weakness, decrease range of motion. Uncomfortable to sleep. No new injury.  Having difficulty raising his left arm over his head.  No skin changes or obvious deformity.  Left knee, has had history of arthroscopy several years ago. Complains of mostly medial knee pain, weakness, swelling. Symptoms have been progressively getting worse. No new injury.  Has noted progressive "bowlegs".  He is having considerable compromise of his activities related to his knee pain which he rates as at least a "10"  Patient has been taking tylenol arthritis with no relief.   HPI  Review of Systems   Objective: Vital Signs: Ht 5' 6.5" (1.689 m)   Wt 190 lb (86.2 kg)   BMI 30.21 kg/m   Physical Exam Constitutional:      Appearance: He is well-developed.  Eyes:     Pupils: Pupils are equal, round, and reactive to light.  Pulmonary:     Effort: Pulmonary effort is normal.  Skin:    General: Skin is warm and dry.  Neurological:     Mental Status: He is alert and oriented to person, place, and time.  Psychiatric:        Behavior: Behavior normal.     Ortho Exam awake alert and oriented x3.  Comfortable sitting.  Left shoulder with positive impingement and empty can testing.  Good strength.  Biceps intact.  Skin around left shoulder intact.  Some discomfort along the anterior subacromial region.  No pain at the [facility] joint  Left knee with positive effusion.  Lacks 10 to 15 degrees to full extension with increased varus and palpable osteophytes along the medial compartment.  Positive patella crepitation.  Flexed about 102 degrees with a goniometer.  No popliteal pain.  No distal edema.  Straight leg raise negative.   Painless range of motion both hips.  Walks with an obvious limp  Specialty Comments:  No specialty comments available.  Imaging: XR KNEE 3 VIEW LEFT  Result Date: 04/22/2019 Films of the left knee were obtained in 3 projections standing.  There is significant tricompartmental degenerative changes with about 10 degrees of varus.  Significant narrowing and near complete loss of the joint space medially.  Peripheral osteophytes in all 3 compartments with subchondral sclerosis.  Films are consistent with advanced end-stage osteoarthritis.  No fractures or ectopic calcification  XR Shoulder Left  Result Date: 04/22/2019 Films of the left shoulder obtained in several projections.  The humeral head is centered in the glenoid.  There is slight superior migration.  No ectopic calcification.  Still normal space between the humeral head and the acromion.  Degenerative changes at the chromic clavicular joint.  No fractures.  No obvious glenohumeral arthritis.  Symptoms could be consistent with early rotator cuff arthropathy    PMFS History: Patient Active Problem List   Diagnosis Date Noted  . Bilateral primary osteoarthritis of knee 04/22/2019  . Pain in left shoulder 04/22/2019  . FLATULENCE-GAS-BLOATING 11/14/2009  . PERSONAL HX COLON CANCER 11/14/2009   Past Medical History:  Diagnosis Date  . Cancer ([place])   . Diabetes mellitus without complication ([place])   . Hypertension     Family History  Problem Relation Age of Onset  . Colon cancer Neg Hx   . Esophageal cancer Neg Hx   . Rectal cancer Neg Hx   . Stomach cancer Neg Hx     Past Surgical History:  Procedure Laterality Date  . COLON SURGERY    . FINGER SURGERY     thumb surgery- September 2016  . ROTATOR CUFF REPAIR     Social History   Occupational History  . Not on file  Tobacco Use  . Smoking status: Former Research scientist (life sciences)  . Smokeless tobacco: Never Used  Substance and Sexual Activity  . Alcohol use: No  . Drug use: No  .  Sexual activity: Not on file

## 2019-05-26 ENCOUNTER — Other Ambulatory Visit: Payer: Self-pay

## 2019-05-26 ENCOUNTER — Other Ambulatory Visit: Payer: Self-pay | Admitting: Family

## 2019-06-01 ENCOUNTER — Ambulatory Visit: Payer: Medicare PPO

## 2019-06-05 ENCOUNTER — Other Ambulatory Visit: Payer: Self-pay

## 2019-06-05 ENCOUNTER — Encounter: Payer: Self-pay | Admitting: Family

## 2019-06-05 ENCOUNTER — Ambulatory Visit: Payer: Medicare PPO | Admitting: Family

## 2019-06-05 DIAGNOSIS — M170 Bilateral primary osteoarthritis of knee: Secondary | ICD-10-CM

## 2019-06-05 NOTE — Progress Notes (Signed)
Joni Fears, MD   Biagio Borg, PA-C [address] [place],   [address]                             [phone]   ORTHOPAEDIC HISTORY & PHYSICAL  Lance Crosby MRN:  [id number] DOB/SEX:  [birth year]/male  CHIEF COMPLAINT:  Painful left Knee  HISTORY: Patient is a 74 y.o. male presented with a history of pain in the left knee for greater 10 years. Onset of symptoms was gradual starting 10 years ago with gradually worsening course since that time. Prior procedures on the knee are arthroscopy. Patient has been treated conservatively with over-the-counter NSAIDs and activity modification. Patient currently rates pain in the knee at 7 out of 10 with activity. There is no pain at night. present.  They have been previously treated with: NSAIDS: NSAID, Tylenol with no improvement  Knee injection with corticosteroid  was not performed Knee injection with visco supplementation was not performed Medications: NSAID, Tylenol with no improvement  PAST MEDICAL HISTORY: Patient Active Problem List   Diagnosis Date Noted  . Bilateral primary osteoarthritis of knee 04/22/2019  . Pain in left shoulder 04/22/2019  . FLATULENCE-GAS-BLOATING 11/14/2009  . PERSONAL HX COLON CANCER 11/14/2009   Past Medical History:  Diagnosis Date  . Cancer ([place])   . Diabetes mellitus without complication ([place])   . Hypertension    Past Surgical History:  Procedure Laterality Date  . COLON SURGERY    . FINGER SURGERY     thumb surgery- September 2016  . ROTATOR CUFF REPAIR       MEDICATIONS PRIOR TO ADMISSION:  Current Outpatient Medications:  .  amLODipine (NORVASC) 10 MG tablet, Take 10 mg by mouth daily., Disp: , Rfl:  .  atenolol (TENORMIN) 50 MG tablet, Take 50 mg by mouth daily. , Disp: , Rfl:  .  atorvastatin (LIPITOR) 80 MG tablet, Take 80 mg by mouth daily., Disp: , Rfl:  .  diphenhydrAMINE (BENADRYL) 25 MG tablet, Take 1 tablet (25 mg total) by mouth every 6 (six) hours. X  3 days then PRN itching, allergic reaction (Patient not taking: Reported on 05/29/2019), Disp: 20 tablet, Rfl: 0 .  EPINEPHrine 0.3 mg/0.3 mL IJ SOAJ injection, Inject 0.3 mLs (0.3 mg total) into the muscle once as needed (severe allergic reaction). (Patient not taking: Reported on 01/26/2015), Disp: 1 Device, Rfl: 0 .  famotidine (PEPCID) 20 MG tablet, Take 1 tablet (20 mg total) by mouth 2 (two) times daily. X 3 days than PRN allergic reaction (Patient not taking: Reported on 01/26/2015), Disp: 20 tablet, Rfl: 0 .  hydrochlorothiazide (HYDRODIURIL) 25 MG tablet, Take 25 mg by mouth daily., Disp: , Rfl:  .  losartan (COZAAR) 50 MG tablet, Take 50 mg by mouth daily., Disp: , Rfl:  .  metFORMIN (GLUCOPHAGE) 1000 MG tablet, Take 1,000 mg by mouth 2 (two) times daily with a meal., Disp: , Rfl:  .  methocarbamol (ROBAXIN) 500 MG tablet, Take 1 tablet (500 mg total) by mouth 2 (two) times daily. (Patient not taking: Reported on 05/29/2019), Disp: 20 tablet, Rfl: 0 .  oxyCODONE-acetaminophen (PERCOCET/ROXICET) 5-325 MG tablet, Take 1-2 tablets by mouth every 6 (six) hours as needed for severe pain. (Patient not taking: Reported on 05/29/2019), Disp: 6 tablet, Rfl: 0 .  Semaglutide (RYBELSUS) 3 MG TABS, Take 3 mg by mouth in the morning and at bedtime., Disp: , Rfl:  ALLERGIES:   Allergies  Allergen Reactions  . Other     Pt states "steroids caused increase in blood sugar."     REVIEW OF SYSTEMS:  Review of Systems  Musculoskeletal: Positive for joint pain. Negative for falls and myalgias.  Neurological: Negative for weakness.  All other systems reviewed and are negative.   FAMILY HISTORY:   Family History  Problem Relation Age of Onset  . Colon cancer Neg Hx   . Esophageal cancer Neg Hx   . Rectal cancer Neg Hx   . Stomach cancer Neg Hx     SOCIAL HISTORY:   Social History   Occupational History  . Not on file  Tobacco Use  . Smoking status: Former Research scientist (life sciences)  . Smokeless tobacco: Never  Used  Substance and Sexual Activity  . Alcohol use: No  . Drug use: No  . Sexual activity: Not on file     EXAMINATION:  Vital signs in last 24 hours: There were no vitals taken for this visit.  Physical Exam Nursing note reviewed.  Constitutional:      Appearance: He is well-developed.  Cardiovascular:     Rate and Rhythm: Normal rate and regular rhythm.  Pulmonary:     Effort: Pulmonary effort is normal.     Breath sounds: Normal breath sounds. No wheezing.  Musculoskeletal:        General: No tenderness or signs of injury.  Skin:    General: Skin is warm and dry.     Capillary Refill: Capillary refill takes less than 2 seconds.  Neurological:     Mental Status: He is alert and oriented to person, place, and time.    Ortho Exam Left knee with positive effusion.  Lacks 10 to 15 degrees to full extension with increased varus and palpable osteophytes along the medial compartment.  Positive patella crepitation.  Flexed about 100 degrees.  No popliteal pain.  No distal edema.  Straight leg raise negative.  Painless range of motion both hips.  Walks with an obvious limp  Imaging Review Plain radiographs demonstrate severe degenerative joint disease of the left knee. The overall alignment is mild varus. The bone quality appears to be adequate for age and reported activity level.  ASSESSMENT: End stage arthritis, left knee  Past Medical History:  Diagnosis Date  . Cancer ([place])   . Diabetes mellitus without complication ([place])   . Hypertension     PLAN: Plan for left total knee replacement.  The patient history, physical examination and imaging studies are consistent with advanced degenerative joint disease of the left knee. The patient has failed conservative treatment.  The clearance notes were reviewed.  After discussion with the patient it was felt that Total Knee Replacement was indicated. The procedure,  risks, and benefits of total knee arthroplasty were presented and  reviewed. The risks including but not limited to aseptic loosening, infection, blood clots, vascular and nerve injury, stiffness, patella tracking problems and fracture complications among others were discussed. The patient acknowledged the explanation, agreed to proceed with total knee replacement.  The patient is interested in a rehab stay following his total knee arthroplasty unfortunately he does not have anyone who could help him in the home his wife lives at home and she is requiring care that he is providing he is concerned that he will not be able to care for himself much less his wife if he is discharged home after his joint replacement Jackelyn Poling will discuss this with Judeen Hammans T and  we will work on getting him set up with a rehab stay  If he is discharged home he is requesting a hospital bed bedside commode as well as a walker

## 2019-06-05 NOTE — Patient Instructions (Signed)
DUE TO COVID-19 ONLY ONE VISITOR IS ALLOWED TO COME WITH YOU AND STAY IN THE WAITING ROOM ONLY DURING PRE OP AND PROCEDURE DAY OF SURGERY. THE 1 VISITOR MAY VISIT WITH YOU AFTER SURGERY IN YOUR PRIVATE ROOM DURING VISITING HOURS ONLY!  YOU NEED TO HAVE A COVID 19 TEST ON_2/26______ @_2 :00______, THIS TEST MUST BE DONE BEFORE SURGERY, COME  [place] , [address].  ([place]) ONCE YOUR COVID TEST IS COMPLETED, PLEASE BEGIN THE QUARANTINE INSTRUCTIONS AS OUTLINED IN YOUR HANDOUT.                Arlyce Dice    Your procedure is scheduled on: 06/16/19   Report to [facility] Main  Entrance   Report to Short Stay 5:30 AM     Call this number if you have problems the morning of surgery [place], NO CHEWING GUM CANDY OR MINTS.   Do not eat food After Midnight.   YOU MAY HAVE CLEAR LIQUIDS FROM MIDNIGHT UNTIL 4:30AM.   At 4:30AM Please finish the prescribed Pre-Surgery Gatorade drink  . Nothing by mouth after you finish the Gatorade drink !   Take these medicines the morning of surgery with A SIP OF WATER: Atenolol, Amlodipine, Pepcid  DO NOT TAKE ANY DIABETIC MEDICATIONS DAY OF YOUR SURGERY     How to Manage Your Diabetes Before and After Surgery  Why is it important to control my blood sugar before and after surgery? . Improving blood sugar levels before and after surgery helps healing and can limit problems. . A way of improving blood sugar control is eating a healthy diet by: o  Eating less sugar and carbohydrates o  Increasing activity/exercise o  Talking with your doctor about reaching your blood sugar goals . High blood sugars (greater than 180 mg/dL) can raise your risk of infections and slow your recovery, so you will need to focus on controlling your diabetes during the weeks before surgery. . Make sure that the doctor who takes care of your diabetes knows  about your planned surgery including the date and location.  How do I manage my blood sugar before surgery? . Check your blood sugar at least 4 times a day, starting 2 days before surgery, to make sure that the level is not too high or low. o Check your blood sugar the morning of your surgery when you wake up and every 2 hours until you get to the Short Stay unit. . If your blood sugar is less than 70 mg/dL, you will need to treat for low blood sugar: o Do not take insulin. o Treat a low blood sugar (less than 70 mg/dL) with  cup of clear juice (cranberry or apple), 4 glucose tablets, OR glucose gel. o Recheck blood sugar in 15 minutes after treatment (to make sure it is greater than 70 mg/dL). If your blood sugar is not greater than 70 mg/dL on recheck, call [phone] for further instructions. . Report your blood sugar to the short stay nurse when you get to Short Stay.  . If you are admitted to the hospital after surgery: o Your blood sugar will be checked by the staff and you will probably be given insulin after surgery (instead of oral diabetes medicines) to make sure you have good blood sugar levels. o The goal for blood sugar control after surgery is 80-180 mg/dL.  WHAT DO I DO ABOUT MY DIABETES MEDICATION?  Marland Kitchen Do not take oral diabetes medicines (pills) the morning of surge.      I              You may not have any metal on your body including               piercings  Do not wear jewelry,  lotions, powders or deodorant              Men may shave face and neck.   Do not bring valuables to the hospital. [place].  Contacts, dentures or bridgework may not be worn into surgery.       Patients discharged the day of surgery will not be allowed to drive home.   IF YOU ARE HAVING SURGERY AND GOING HOME THE SAME DAY, YOU MUST HAVE AN ADULT TO DRIVE YOU HOME AND BE WITH YOU FOR 24 HOURS.    YOU MAY GO HOME BY TAXI OR UBER OR  ORTHERWISE, BUT AN ADULT MUST ACCOMPANY YOU HOME AND STAY WITH YOU FOR 24 HOURS.  Name and phone number of your driver:  Special Instructions: N/A              Please read over the following fact sheets you were given: _____________________________________________________________________             [facility] - Preparing for Surgery Before surgery, you can play an important role.   Because skin is not sterile, your skin needs to be as free of germs as possible.   You can reduce the number of germs on your skin by washing with CHG (chlorahexidine gluconate) soap before surgery .  CHG is an antiseptic cleaner which kills germs and bonds with the skin to continue killing germs even after washing. Please DO NOT use if you have an allergy to CHG or antibacterial soaps.   If your skin becomes reddened/irritated stop using the CHG and inform your nurse when you arrive at Short Stay.  You may shave your face/neck.  Please follow these instructions carefully:  1.  Shower with CHG Soap the night before surgery and the  morning of Surgery.  2.  If you choose to wash your hair, wash your hair first as usual with your  normal  shampoo.  3.  After you shampoo, rinse your hair and body thoroughly to remove the  shampoo.                                        4.  Use CHG as you would any other liquid soap.  You can apply chg directly  to the skin and wash                       Gently with a scrungie or clean washcloth.  5.  Apply the CHG Soap to your body ONLY FROM THE NECK DOWN.   Do not use on face/ open                           Wound or open sores. Avoid contact with eyes, ears mouth and genitals (private parts).  Wash face,  Genitals (private parts) with your normal soap.             6.  Wash thoroughly, paying special attention to the area where your surgery  will be performed.  7.  Thoroughly rinse your body with warm water from the neck down.  8.  DO NOT shower/wash with  your normal soap after using and rinsing off  the CHG Soap.             9.  Pat yourself dry with a clean towel.            10.  Wear clean pajamas.            11.  Place clean sheets on your bed the night of your first shower and do not  sleep with pets. Day of Surgery : Do not apply any lotions/deodorants the morning of surgery.  Please wear clean clothes to the hospital/surgery center.  FAILURE TO FOLLOW THESE INSTRUCTIONS MAY RESULT IN THE CANCELLATION OF YOUR SURGERY PATIENT SIGNATURE_________________________________  NURSE SIGNATURE__________________________________  ________________________________________________________________________   Adam Phenix  An incentive spirometer is a tool that can help keep your lungs clear and active. This tool measures how well you are filling your lungs with each breath. Taking long deep breaths may help reverse or decrease the chance of developing breathing (pulmonary) problems (especially infection) following:  A long period of time when you are unable to move or be active. BEFORE THE PROCEDURE   If the spirometer includes an indicator to show your best effort, your nurse or respiratory therapist will set it to a desired goal.  If possible, sit up straight or lean slightly forward. Try not to slouch.  Hold the incentive spirometer in an upright position. INSTRUCTIONS FOR USE  1. Sit on the edge of your bed if possible, or sit up as far as you can in bed or on a chair. 2. Hold the incentive spirometer in an upright position. 3. Breathe out normally. 4. Place the mouthpiece in your mouth and seal your lips tightly around it. 5. Breathe in slowly and as deeply as possible, raising the piston or the ball toward the top of the column. 6. Hold your breath for 3-5 seconds or for as long as possible. Allow the piston or ball to fall to the bottom of the column. 7. Remove the mouthpiece from your mouth and breathe out normally. 8. Rest for  a few seconds and repeat Steps 1 through 7 at least 10 times every 1-2 hours when you are awake. Take your time and take a few normal breaths between deep breaths. 9. The spirometer may include an indicator to show your best effort. Use the indicator as a goal to work toward during each repetition. 10. After each set of 10 deep breaths, practice coughing to be sure your lungs are clear. If you have an incision (the cut made at the time of surgery), support your incision when coughing by placing a pillow or rolled up towels firmly against it. Once you are able to get out of bed, walk around indoors and cough well. You may stop using the incentive spirometer when instructed by your caregiver.  RISKS AND COMPLICATIONS  Take your time so you do not get dizzy or light-headed.  If you are in pain, you may need to take or ask for pain medication before doing incentive spirometry. It is harder to take a deep breath if you are having pain. AFTER USE  Rest and breathe slowly and easily.  It can be helpful to keep track of a log of your progress. Your caregiver can provide you with a simple table to help with this. If you are using the spirometer at home, follow these instructions: [place] IF:   You are having difficultly using the spirometer.  You have trouble using the spirometer as often as instructed.  Your pain medication is not giving enough relief while using the spirometer.  You develop fever of 100.5 F (38.1 C) or higher. SEEK IMMEDIATE MEDICAL CARE IF:   You cough up bloody sputum that had not been present before.  You develop fever of 102 F (38.9 C) or greater.  You develop worsening pain at or near the incision site. MAKE SURE YOU:   Understand these instructions.  Will watch your condition.  Will get help right away if you are not doing well or get worse. Document Released: 08/13/2006 Document Revised: 06/25/2011 Document Reviewed: 10/14/2006 [facility] Patient  Information 2014 [place], [place].   ________________________________________________________________________

## 2019-06-08 ENCOUNTER — Encounter (HOSPITAL_COMMUNITY): Payer: Self-pay

## 2019-06-08 ENCOUNTER — Encounter (HOSPITAL_COMMUNITY)
Admission: RE | Admit: 2019-06-08 | Discharge: 2019-06-08 | Disposition: A | Discharge status: Home / Self Care | Payer: Medicare PPO | Source: Ambulatory Visit | Attending: Orthopaedic Surgery | Admitting: Orthopaedic Surgery

## 2019-06-08 ENCOUNTER — Other Ambulatory Visit: Payer: Self-pay

## 2019-06-08 ENCOUNTER — Telehealth: Payer: Self-pay | Admitting: Orthopaedic Surgery

## 2019-06-08 DIAGNOSIS — Z01818 Encounter for other preprocedural examination: Secondary | ICD-10-CM | POA: Insufficient documentation

## 2019-06-08 DIAGNOSIS — Z01810 Encounter for preprocedural cardiovascular examination: Secondary | ICD-10-CM | POA: Diagnosis not present

## 2019-06-08 DIAGNOSIS — E119 Type 2 diabetes mellitus without complications: Secondary | ICD-10-CM | POA: Diagnosis not present

## 2019-06-08 DIAGNOSIS — I440 Atrioventricular block, first degree: Secondary | ICD-10-CM | POA: Insufficient documentation

## 2019-06-08 DIAGNOSIS — R001 Bradycardia, unspecified: Secondary | ICD-10-CM | POA: Insufficient documentation

## 2019-06-08 DIAGNOSIS — Z01812 Encounter for preprocedural laboratory examination: Secondary | ICD-10-CM | POA: Insufficient documentation

## 2019-06-08 DIAGNOSIS — R9431 Abnormal electrocardiogram [ECG] [EKG]: Secondary | ICD-10-CM | POA: Diagnosis not present

## 2019-06-08 LAB — COMPREHENSIVE METABOLIC PANEL
ALT: 31 U/L (ref 0–44)
AST: 20 U/L (ref 15–41)
Albumin: 4.7 g/dL (ref 3.5–5.0)
Alkaline Phosphatase: 73 U/L (ref 38–126)
Anion gap: 9 (ref 5–15)
BUN: 13 mg/dL (ref 8–23)
CO2: 27 mmol/L (ref 22–32)
Calcium: 10 mg/dL (ref 8.9–10.3)
Chloride: 102 mmol/L (ref 98–111)
Creatinine, Ser: 0.77 mg/dL (ref 0.61–1.24)
GFR calc Af Amer: >60 mL/min (ref >60)
GFR calc non Af Amer: >60 mL/min (ref >60)
Glucose, Bld: 218 mg/dL — ABNORMAL HIGH (ref 70–99)
Potassium: 3.9 mmol/L (ref 3.5–5.1)
Sodium: 138 mmol/L (ref 135–145)
Total Bilirubin: 1.2 mg/dL (ref 0.3–1.2)
Total Protein: 7.9 g/dL (ref 6.5–8.1)

## 2019-06-08 LAB — HEMOGLOBIN A1C
Hgb A1c MFr Bld: 8.8 % — ABNORMAL HIGH (ref 4.8–5.6)
Mean Plasma Glucose: 205.86 mg/dL

## 2019-06-08 LAB — URINALYSIS, COMPLETE (UACMP) WITH MICROSCOPIC
Bacteria, UA: NONE SEEN
Bilirubin Urine: NEGATIVE
Glucose, UA: NEGATIVE mg/dL
Hgb urine dipstick: NEGATIVE
Ketones, ur: NEGATIVE mg/dL
Leukocytes,Ua: NEGATIVE
Nitrite: NEGATIVE
Protein, ur: NEGATIVE mg/dL
Specific Gravity, Urine: 1.011 (ref 1.005–1.030)
pH: 6 (ref 5.0–8.0)

## 2019-06-08 LAB — GLUCOSE, CAPILLARY: Glucose-Capillary: 201 mg/dL — ABNORMAL HIGH (ref 70–99)

## 2019-06-08 LAB — CBC WITH DIFFERENTIAL/PLATELET
Abs Immature Granulocytes: 0.04 10*3/uL (ref 0.00–0.07)
Basophils Absolute: 0.1 10*3/uL (ref 0.0–0.1)
Basophils Relative: 1 %
Eosinophils Absolute: 0.1 10*3/uL (ref 0.0–0.5)
Eosinophils Relative: 2 %
HCT: 45.5 % (ref 39.0–52.0)
Hemoglobin: 15.4 g/dL (ref 13.0–17.0)
Immature Granulocytes: 1 %
Lymphocytes Relative: 34 %
Lymphs Abs: 2 10*3/uL (ref 0.7–4.0)
MCH: 29.1 pg (ref 26.0–34.0)
MCHC: 33.8 g/dL (ref 30.0–36.0)
MCV: 85.8 fL (ref 80.0–100.0)
Monocytes Absolute: 0.8 10*3/uL (ref 0.1–1.0)
Monocytes Relative: 13 %
Neutro Abs: 2.9 10*3/uL (ref 1.7–7.7)
Neutrophils Relative %: 49 %
Platelets: 235 10*3/uL (ref 150–400)
RBC: 5.3 MIL/uL (ref 4.22–5.81)
RDW: 14.1 % (ref 11.5–15.5)
WBC: 6 10*3/uL (ref 4.0–10.5)
nRBC: 0 % (ref 0.0–0.2)

## 2019-06-08 LAB — APTT: aPTT: 51 seconds — ABNORMAL HIGH (ref 24–36)

## 2019-06-08 LAB — SURGICAL PCR SCREEN
MRSA, PCR: NEGATIVE
Staphylococcus aureus: NEGATIVE

## 2019-06-08 LAB — PROTIME-INR
INR: 1.1 (ref 0.8–1.2)
Prothrombin Time: 13.7 seconds (ref 11.4–15.2)

## 2019-06-08 NOTE — Progress Notes (Signed)
PCP - Dr. Lemmie Evens Cardiologist - none  Chest x-ray - no EKG - 06/08/19 Stress Test - no ECHO - no Cardiac Cath - no  Sleep Study - no CPAP -   Fasting Blood Sugar - Pt can't remember but reports that he has never had a CBG >200. Checks Blood Sugar _____ times a day once a month  Blood Thinner Instructions:NA Aspirin Instructions: Last Dose:  Anesthesia review:   Patient denies shortness of breath, fever, cough and chest pain at PAT appointment yes  Patient verbalized understanding of instructions that were given to them at the PAT appointment. Patient was also instructed that they will need to review over the PAT instructions again at home before surgery. yes

## 2019-06-08 NOTE — Telephone Encounter (Signed)
Patient would like to know if Dr. Durward Fortes can writing a Rx for a hospital bed.  Patient is scheduled for left total knee arthoplasty with overnight observation on March 2nd at [facility].  He is concerned because he has a flight of stairs going up to his bedroom.  He would like to have the hospital bed downstairs in his den.  If this cannot be arranged he may have to make arrangements to stay in a facility after surgery.  He states his wife is unable to take care of him because she has severe rheumatoid arthritis (and he normally has to help her).  He does not want to wait until the last minute while being admitted to the hospital to make these arrangements.

## 2019-06-09 ENCOUNTER — Telehealth: Payer: Self-pay | Admitting: Orthopaedic Surgery

## 2019-06-09 NOTE — Telephone Encounter (Signed)
Called patient regarding A1c being to high to schedule surgery at this time. He did not answer and not able to leave voice mail as the mail box is full.  I will call later today to discuss patient following up with PCP and rescheduling his left total knee when his A1c is under control.

## 2019-06-09 NOTE — Progress Notes (Signed)
Lance Crosby needs to see his primary care MD before we can operate-his hemoglobin A1C is too high and will be cancelled unless released by his doc. Please call. Thanks

## 2019-06-09 NOTE — Telephone Encounter (Signed)
Just got word that patient's A1C is too high for surgery.

## 2019-06-09 NOTE — Telephone Encounter (Signed)
I called patient in reference to his A1c results of 8.8.  He  understands why his total knee surgery is cancelled at this time.  He plans to call his PCP today and will follow up in the near future to reschedule once his sugar is under control.

## 2019-06-12 ENCOUNTER — Other Ambulatory Visit (HOSPITAL_COMMUNITY): Payer: Medicare PPO

## 2019-06-16 ENCOUNTER — Ambulatory Visit (HOSPITAL_COMMUNITY)
Admission: RE | Admit: 2019-06-16 | Payer: Medicare PPO | Source: Other Acute Inpatient Hospital | Admitting: Orthopaedic Surgery

## 2019-06-16 ENCOUNTER — Encounter (HOSPITAL_COMMUNITY): Admission: RE | Payer: Self-pay | Source: Other Acute Inpatient Hospital

## 2019-06-16 SURGERY — ARTHROPLASTY, KNEE, TOTAL
Anesthesia: Choice | Site: Knee | Laterality: Left

## 2019-06-30 ENCOUNTER — Inpatient Hospital Stay: Payer: Medicare PPO | Admitting: Orthopaedic Surgery

## 2019-08-11 ENCOUNTER — Ambulatory Visit (INDEPENDENT_AMBULATORY_CARE_PROVIDER_SITE_OTHER): Payer: Medicare PPO | Admitting: Family

## 2019-08-11 ENCOUNTER — Other Ambulatory Visit: Payer: Self-pay

## 2019-08-11 ENCOUNTER — Ambulatory Visit (INDEPENDENT_AMBULATORY_CARE_PROVIDER_SITE_OTHER): Payer: Medicare PPO

## 2019-08-11 ENCOUNTER — Encounter: Payer: Self-pay | Admitting: Family

## 2019-08-11 VITALS — Ht 66.0 in | Wt 195.0 lb

## 2019-08-11 DIAGNOSIS — M7542 Impingement syndrome of left shoulder: Secondary | ICD-10-CM | POA: Diagnosis not present

## 2019-08-11 DIAGNOSIS — M542 Cervicalgia: Secondary | ICD-10-CM

## 2019-08-11 MED ORDER — LIDOCAINE HCL 1 % IJ SOLN
5.0000 mL | INTRAMUSCULAR | Status: AC | PRN
  Administered 2019-08-11: 5 mL

## 2019-08-11 MED ORDER — METHYLPREDNISOLONE ACETATE 40 MG/ML IJ SUSP
40.0000 mg | INTRAMUSCULAR | Status: AC | PRN
  Administered 2019-08-11: 40 mg via INTRA_ARTICULAR

## 2019-08-11 NOTE — Progress Notes (Signed)
Office Visit Note   Patient: Lance Crosby           Date of Birth: Apr 29, 1945           MRN: [id number] Visit Date: 08/11/2019              Requested by: Kristie Cowman, MD [address] [place],  [place] [address] PCP: Kristie Cowman, MD  Chief Complaint  Patient presents with  . Left Shoulder - Pain  . Neck - Pain      HPI: The patient is a 74 year old gentleman seen today for a complaint of left shoulder and left neck pain. Ongoing for several weeks. No known injury. Having anterior shoulder and bicep pain. Radiates to left neck and down to elbow. Some burning pain in hand. Feels weak in hand at times. Cannot reach above head. Pain reaching behind back.  Assessment & Plan: Visit Diagnoses:  1. Neck pain     Plan: Today.  He will follow-up in the office in 3 weeks if no relief..  Consider MRI left shoulder impression partial biceps tendon tear  Follow-Up Instructions: Return in about 3 weeks (around 09/01/2019), or if symptoms worsen or fail to improve.   Back Exam   Tenderness  The patient is experiencing no tenderness.    Right Hand Exam   Muscle Strength  Grip: 5/5    Left Hand Exam   Muscle Strength  Grip:  5/5    Left Shoulder Exam   Tenderness  The patient is experiencing tenderness in the biceps tendon.  Range of Motion  Left shoulder external rotation: painful.   Muscle Strength  The patient has normal left shoulder strength.  Tests  Impingement: positive Drop arm: positive  Other  Pulse: present       Patient is alert, oriented, no adenopathy, well-dressed, normal affect, normal respiratory effort.   Imaging: XR Cervical Spine 2 or 3 views  Result Date: 08/11/2019 Radiographs of the cervical spine show osteophytic spurring. No spondylolisthesis. No acute finding.  No images are attached to the encounter.  Labs: Lab Results  Component Value Date   HGBA1C 8.8 (H) 06/08/2019     Lab Results  Component Value Date   ALBUMIN 4.7 06/08/2019   ALBUMIN 4.6 02/15/2019   ALBUMIN 4.0 03/22/2014    No results found for: MG No results found for: VD25OH  No results found for: PREALBUMIN CBC EXTENDED Latest Ref Rng & Units 06/08/2019 02/15/2019 02/01/2015  WBC 4.0 - 10.5 K/uL 6.0 5.0 5.8  RBC 4.22 - 5.81 MIL/uL 5.30 5.08 5.00  HGB 13.0 - 17.0 g/dL 15.4 15.0 14.9  HCT 39.0 - 52.0 % 45.5 45.1 41.7  PLT 150 - 400 K/uL 235 301 273  NEUTROABS 1.7 - 7.7 K/uL 2.9 - -  LYMPHSABS 0.7 - 4.0 K/uL 2.0 - -     Body mass index is 31.47 kg/m.  Orders:  Orders Placed This Encounter  Procedures  . XR Cervical Spine 2 or 3 views   No orders of the defined types were placed in this encounter.    Procedures: Large Joint Inj: L subacromial bursa on 08/11/2019 10:16 AM Indications: pain Details: 22 G 1.5 in needle Medications: 5 mL lidocaine 1 %; 40 mg methylPREDNISolone acetate 40 MG/ML Consent was given by the patient.      Clinical Data: No additional findings.  ROS:  All other systems negative, except as noted in the HPI. Review of Systems  Constitutional: Negative for chills  and fever.  Musculoskeletal: Positive for arthralgias and myalgias.  Neurological: Positive for weakness. Negative for numbness.    Objective: Vital Signs: Ht 5\' 6"  (1.676 m)   Wt 195 lb (88.5 kg)   BMI 31.47 kg/m   Specialty Comments:  No specialty comments available.  PMFS History: Patient Active Problem List   Diagnosis Date Noted  . Bilateral primary osteoarthritis of knee 04/22/2019  . Pain in left shoulder 04/22/2019  . FLATULENCE-GAS-BLOATING 11/14/2009  . PERSONAL HX COLON CANCER 11/14/2009   Past Medical History:  Diagnosis Date  . Cancer ([place])   . Diabetes mellitus without complication ([place])   . Hypertension     Family History  Problem Relation Age of Onset  . Colon cancer Neg Hx   . Esophageal cancer Neg Hx   . Rectal cancer Neg Hx   . Stomach cancer Neg Hx     Past Surgical History:  Procedure  Laterality Date  . COLON SURGERY    . FINGER SURGERY     thumb surgery- September 2016  . ROTATOR CUFF REPAIR     Social History   Occupational History  . Not on file  Tobacco Use  . Smoking status: Former Smoker    Packs/day: 1.00    Years: 5.00    Pack years: 5.00    Types: Cigarettes    Quit date: 06/07/1973    Years since quitting: 46.2  . Smokeless tobacco: Never Used  Substance and Sexual Activity  . Alcohol use: No  . Drug use: No  . Sexual activity: Not on file

## 2019-08-20 ENCOUNTER — Ambulatory Visit: Payer: Medicare PPO | Admitting: Orthopaedic Surgery

## 2019-10-13 ENCOUNTER — Encounter (HOSPITAL_COMMUNITY): Payer: Self-pay

## 2019-10-13 ENCOUNTER — Emergency Department (HOSPITAL_COMMUNITY)
Admission: EM | Admit: 2019-10-13 | Discharge: 2019-10-13 | Disposition: A | Discharge status: Home / Self Care | Payer: Medicare PPO | Attending: Emergency Medicine | Admitting: Emergency Medicine

## 2019-10-13 ENCOUNTER — Other Ambulatory Visit: Payer: Self-pay

## 2019-10-13 DIAGNOSIS — I10 Essential (primary) hypertension: Secondary | ICD-10-CM | POA: Diagnosis not present

## 2019-10-13 DIAGNOSIS — T782XXA Anaphylactic shock, unspecified, initial encounter: Secondary | ICD-10-CM | POA: Diagnosis not present

## 2019-10-13 DIAGNOSIS — Z7984 Long term (current) use of oral hypoglycemic drugs: Secondary | ICD-10-CM | POA: Insufficient documentation

## 2019-10-13 DIAGNOSIS — Z79899 Other long term (current) drug therapy: Secondary | ICD-10-CM | POA: Diagnosis not present

## 2019-10-13 DIAGNOSIS — Z87891 Personal history of nicotine dependence: Secondary | ICD-10-CM | POA: Insufficient documentation

## 2019-10-13 DIAGNOSIS — E119 Type 2 diabetes mellitus without complications: Secondary | ICD-10-CM | POA: Insufficient documentation

## 2019-10-13 DIAGNOSIS — T63441A Toxic effect of venom of bees, accidental (unintentional), initial encounter: Secondary | ICD-10-CM | POA: Diagnosis not present

## 2019-10-13 DIAGNOSIS — T7840XA Allergy, unspecified, initial encounter: Secondary | ICD-10-CM | POA: Diagnosis present

## 2019-10-13 LAB — BASIC METABOLIC PANEL
Anion gap: 12 (ref 5–15)
BUN: 15 mg/dL (ref 8–23)
CO2: 24 mmol/L (ref 22–32)
Calcium: 9.9 mg/dL (ref 8.9–10.3)
Chloride: 103 mmol/L (ref 98–111)
Creatinine, Ser: 0.92 mg/dL (ref 0.61–1.24)
GFR calc Af Amer: >60 mL/min (ref >60)
GFR calc non Af Amer: >60 mL/min (ref >60)
Glucose, Bld: 152 mg/dL — ABNORMAL HIGH (ref 70–99)
Potassium: 3.8 mmol/L (ref 3.5–5.1)
Sodium: 139 mmol/L (ref 135–145)

## 2019-10-13 LAB — CBC
HCT: 45.7 % (ref 39.0–52.0)
Hemoglobin: 15.4 g/dL (ref 13.0–17.0)
MCH: 29.9 pg (ref 26.0–34.0)
MCHC: 33.7 g/dL (ref 30.0–36.0)
MCV: 88.7 fL (ref 80.0–100.0)
Platelets: 288 10*3/uL (ref 150–400)
RBC: 5.15 MIL/uL (ref 4.22–5.81)
RDW: 14.3 % (ref 11.5–15.5)
WBC: 7.4 10*3/uL (ref 4.0–10.5)
nRBC: 0 % (ref 0.0–0.2)

## 2019-10-13 MED ORDER — EPINEPHRINE 0.3 MG/0.3ML IJ SOAJ
0.3000 mg | Freq: Once | INTRAMUSCULAR | Status: AC
Start: 2019-10-13 — End: 2019-10-13
  Administered 2019-10-13: 0.3 mg via INTRAMUSCULAR
  Filled 2019-10-13: qty 0.3

## 2019-10-13 MED ORDER — EPINEPHRINE 0.3 MG/0.3ML IJ SOAJ: 0.3000 mg | Freq: Once | INTRAMUSCULAR | 1 refills | Status: DC | PRN

## 2019-10-13 MED ORDER — DIPHENHYDRAMINE HCL 50 MG/ML IJ SOLN
25.0000 mg | Freq: Once | INTRAMUSCULAR | Status: AC
  Administered 2019-10-13: 25 mg via INTRAVENOUS
  Filled 2019-10-13: qty 1

## 2019-10-13 MED ORDER — FAMOTIDINE IN NACL 20-0.9 MG/50ML-% IV SOLN
20.0000 mg | Freq: Once | INTRAVENOUS | Status: AC
  Administered 2019-10-13: 20 mg via INTRAVENOUS
  Filled 2019-10-13: qty 50

## 2019-10-13 MED ORDER — PREDNISONE 20 MG PO TABS: 40.0000 mg | ORAL_TABLET | Freq: Every day | ORAL | 0 refills | Status: DC

## 2019-10-13 MED ORDER — FAMOTIDINE 20 MG PO TABS: 20.0000 mg | ORAL_TABLET | Freq: Two times a day (BID) | ORAL | 0 refills | Status: AC

## 2019-10-13 MED ORDER — METHYLPREDNISOLONE SODIUM SUCC 125 MG IJ SOLR
125.0000 mg | Freq: Once | INTRAMUSCULAR | Status: AC
  Administered 2019-10-13: 125 mg via INTRAVENOUS
  Filled 2019-10-13: qty 2

## 2019-10-13 NOTE — Discharge Instructions (Addendum)
You were seen in the emergency department for throat tightening and shortness of breath after a bee sting.  You were given medication and observed for period of time with improvement in your symptoms.  We are refilling your epinephrine pen prescription along with putting you on 3 more days of steroids.  Please follow-up with your doctor and return if any worsening symptoms.

## 2019-10-13 NOTE — ED Triage Notes (Signed)
Pt presents with c/o allergic reaction. Pt reports he was stung by a bee approx one hour ago, reports severe reaction to bee stings in the past. Pt is not noted to be in any distress but reports he is feeling some tightness in his throat with some dizziness as well. Pt able to talk in complete sentences.

## 2019-10-13 NOTE — ED Provider Notes (Signed)
[facility] DEPT Provider Note   CSN: [id number] Arrival date & time: 10/13/19  6767     History Chief Complaint  Patient presents with  . Allergic Reaction    Lance Crosby is a 74 y.o. male.  He is here after he was stung by a bee to his right lower leg about an hour ago.  History of severe reactions in the past.  States he feels a little tightness in his throat and some soreness in his muscles.  Feels a little lightheaded.  No hives or itching.  No syncope.  Has tried nothing for it.  The history is provided by the patient.  Allergic Reaction Presenting symptoms: difficulty breathing   Presenting symptoms: no rash   Severity:  Moderate Duration:  1 hour Prior allergic episodes:  Insect allergies Context: insect bite/sting   Relieved by:  None tried Worsened by:  Nothing Ineffective treatments:  None tried      Past Medical History:  Diagnosis Date  . Cancer ([place])   . Diabetes mellitus without complication ([place])   . Hypertension     Patient Active Problem List   Diagnosis Date Noted  . Bilateral primary osteoarthritis of knee 04/22/2019  . Pain in left shoulder 04/22/2019  . FLATULENCE-GAS-BLOATING 11/14/2009  . PERSONAL HX COLON CANCER 11/14/2009    Past Surgical History:  Procedure Laterality Date  . COLON SURGERY    . FINGER SURGERY     thumb surgery- September 2016  . ROTATOR CUFF REPAIR         Family History  Problem Relation Age of Onset  . Colon cancer Neg Hx   . Esophageal cancer Neg Hx   . Rectal cancer Neg Hx   . Stomach cancer Neg Hx     Social History   Tobacco Use  . Smoking status: Former Smoker    Packs/day: 1.00    Years: 5.00    Pack years: 5.00    Types: Cigarettes    Quit date: 06/07/1973    Years since quitting: 46.3  . Smokeless tobacco: Never Used  Vaping Use  . Vaping Use: Never used  Substance Use Topics  . Alcohol use: No  . Drug use: No    Home Medications Prior to Admission  medications   Medication Sig Start Date End Date Taking? Authorizing Provider  amLODipine (NORVASC) 10 MG tablet Take 10 mg by mouth daily.    [provider]  atenolol (TENORMIN) 50 MG tablet Take 50 mg by mouth daily.     [provider]  atorvastatin (LIPITOR) 80 MG tablet Take 80 mg by mouth daily.    [provider]  diphenhydrAMINE (BENADRYL) 25 MG tablet Take 1 tablet (25 mg total) by mouth every 6 (six) hours. X 3 days then PRN itching, allergic reaction 11/23/14   West, Emily, PA-C  EPINEPHrine 0.3 mg/0.3 mL IJ SOAJ injection Inject 0.3 mLs (0.3 mg total) into the muscle once as needed (severe allergic reaction). 11/23/14   Clayton Bibles, PA-C  famotidine (PEPCID) 20 MG tablet Take 1 tablet (20 mg total) by mouth 2 (two) times daily. X 3 days than PRN allergic reaction 11/23/14   West, Emily, PA-C  hydrochlorothiazide (HYDRODIURIL) 25 MG tablet Take 25 mg by mouth daily.    [provider]  losartan (COZAAR) 50 MG tablet Take 50 mg by mouth daily.    [provider]  metFORMIN (GLUCOPHAGE) 1000 MG tablet Take 1,000 mg by mouth 2 (two)  times daily with a meal.    [provider]  methocarbamol (ROBAXIN) 500 MG tablet Take 1 tablet (500 mg total) by mouth 2 (two) times daily. 02/01/15   Mabe, Forbes Cellar, MD  oxyCODONE-acetaminophen (PERCOCET/ROXICET) 5-325 MG tablet Take 1-2 tablets by mouth every 6 (six) hours as needed for severe pain. 02/01/15   Mabe, Forbes Cellar, MD  Semaglutide (RYBELSUS) 3 MG TABS Take 3 mg by mouth in the morning and at bedtime.    [provider]    Allergies    Other  Review of Systems   Review of Systems  Constitutional: Negative for fever.  HENT: Negative for sore throat and voice change.   Eyes: Negative for visual disturbance.  Respiratory: Positive for shortness of breath.   Cardiovascular: Negative for chest pain.  Gastrointestinal: Negative for abdominal pain, nausea and vomiting.  Genitourinary:  Negative for dysuria.  Musculoskeletal: Positive for myalgias. Negative for neck pain.  Skin: Negative for rash.  Neurological: Positive for light-headedness.    Physical Exam Updated Vital Signs BP (!) 147/95 (BP Location: Left Arm)   Pulse 84   Temp 98 F (36.7 C) (Oral)   Resp 18   Ht 5\' 8"  (1.727 m)   Wt 83.9 kg   SpO2 98%   BMI 28.13 kg/m   Physical Exam Vitals and nursing note reviewed.  Constitutional:      Appearance: Normal appearance. He is well-developed.  HENT:     Head: Normocephalic and atraumatic.     Mouth/Throat:     Mouth: Mucous membranes are moist.     Pharynx: Oropharynx is clear.  Eyes:     Conjunctiva/sclera: Conjunctivae normal.  Neck:     Comments: No stridor Cardiovascular:     Rate and Rhythm: Normal rate and regular rhythm.     Heart sounds: No murmur heard.   Pulmonary:     Effort: Pulmonary effort is normal. No respiratory distress.     Breath sounds: Normal breath sounds.  Abdominal:     Palpations: Abdomen is soft.     Tenderness: There is no abdominal tenderness. There is no guarding or rebound.  Musculoskeletal:        General: No deformity or signs of injury. Normal range of motion.     Cervical back: Neck supple.  Skin:    General: Skin is warm and dry.     Capillary Refill: Capillary refill takes less than 2 seconds.  Neurological:     General: No focal deficit present.     Mental Status: He is alert.     Sensory: No sensory deficit.     Motor: No weakness.     Gait: Gait normal.     ED Results / Procedures / Treatments   Labs (all labs ordered are listed, but only abnormal results are displayed) Labs Reviewed  BASIC METABOLIC PANEL - Abnormal; Notable for the following components:      Result Value   Glucose, Bld 152 (*)    All other components within normal limits  CBC    EKG None  Radiology No results found.  Procedures Procedures (including critical care time)  Medications Ordered in ED Medications    diphenhydrAMINE (BENADRYL) injection 25 mg (25 mg Intravenous Given 10/13/19 0908)  methylPREDNISolone sodium succinate (SOLU-MEDROL) 125 mg/2 mL injection 125 mg (125 mg Intravenous Given 10/13/19 0909)  famotidine (PEPCID) IVPB 20 mg premix ( Intravenous Stopped 10/13/19 0941)  EPINEPHrine (EPI-PEN) injection 0.3 mg (0.3 mg Intramuscular Given 10/13/19  0854)    ED Course  I have reviewed the triage vital signs and the nursing notes.  Pertinent labs & imaging results that were available during my care of the patient were reviewed by me and considered in my medical decision making (see chart for details).  Clinical Course as of Oct 13 1734  Tue Oct 13, 2019  0847 Patient states he did not have access to his EpiPen and so has not had any medication.  He states he feels he needs epinephrine because he has had this before.   [MB]  [id number] Reevaluated patient, he states he is feeling a little bit improved.   [MB]  1209 Patient has been observed for approximately 4 hours with improvement in his symptoms.  He is comfortable with discharge and will return if any worsening of his symptoms.   [MB]    Clinical Course User Index [MB] Hayden Rasmussen, MD   MDM Rules/Calculators/A&P                         This patient complains of allergic reaction with some shortness of breath and tightness in his throat; this involves an extensive number of treatment Options and is a complaint that carries with it a high risk of complications and Morbidity. The differential includes anaphylaxis, anaphylactoid reaction, local reaction, airway compromise  I ordered, reviewed and interpreted labs, which included CBC normal, chemistries normal other than mildly elevated glucose. I ordered medication subcu epinephrine, IV Benadryl, IV steroids, IV Pepcid Previous records obtained and reviewed in epic, no critical coronary disease  After the interventions stated above, I reevaluated the patient and found patient to be  symptomatically improved after 4-hour observation and medications.  He is comfortable managing his symptoms at home.  Return instructions discussed.  Final Clinical Impression(s) / ED Diagnoses Final diagnoses:  Anaphylaxis, initial encounter  Bee sting reaction, accidental or unintentional, initial encounter    Rx / DC Orders ED Discharge Orders         Ordered    EPINEPHrine 0.3 mg/0.3 mL IJ SOAJ injection  Once PRN     Discontinue  Reprint     10/13/19 1212    famotidine (PEPCID) 20 MG tablet  2 times daily     Discontinue  Reprint     10/13/19 1212    predniSONE (DELTASONE) 20 MG tablet  Daily     Discontinue  Reprint     10/13/19 1212           Hayden Rasmussen, MD 10/13/19 1739

## 2019-11-24 ENCOUNTER — Encounter: Payer: Self-pay | Admitting: Family

## 2019-11-24 ENCOUNTER — Ambulatory Visit: Payer: Medicare PPO | Admitting: Family

## 2019-11-24 VITALS — Ht 68.0 in | Wt 185.0 lb

## 2019-11-24 DIAGNOSIS — M170 Bilateral primary osteoarthritis of knee: Secondary | ICD-10-CM

## 2019-11-24 DIAGNOSIS — M1712 Unilateral primary osteoarthritis, left knee: Secondary | ICD-10-CM | POA: Diagnosis not present

## 2019-11-24 DIAGNOSIS — M7542 Impingement syndrome of left shoulder: Secondary | ICD-10-CM

## 2019-11-24 MED ORDER — LIDOCAINE HCL 1 % IJ SOLN
5.0000 mL | INTRAMUSCULAR | Status: AC | PRN
  Administered 2019-11-24: 5 mL

## 2019-11-24 MED ORDER — METHYLPREDNISOLONE ACETATE 40 MG/ML IJ SUSP
40.0000 mg | INTRAMUSCULAR | Status: AC | PRN
  Administered 2019-11-24: 40 mg via INTRA_ARTICULAR

## 2019-11-24 NOTE — Progress Notes (Signed)
Office Visit Note   Patient: Lance Crosby           Date of Birth: [birth year]           MRN: [id number] Visit Date: 11/24/2019              Requested by: Kristie Cowman, MD [address] [place],   [address] PCP: Kristie Cowman, MD  Chief Complaint  Patient presents with  . Left Knee - Pain  . Left Shoulder - Pain    S/p injection 04/2019      HPI: The patient is a 74 year old gentleman seen today for evaluation of left knee pain as well as left shoulder pain.  He had been previously scheduled this year for a total knee arthroplasty with Dr. Durward Fortes of the left knee unfortunately his A1c was not under good control the surgery was canceled.  He has been having increasing pain with ambulation locking and catching of his knee stabbing medial joint line pain states his last Depo-Medrol injection of the left knee was over 25 years ago he would like to try this again today.  He has chronic left shoulder pain this is anterior pain that radiates down his bicep he has pain with above head reaching pain and aching with rest at night.  He did have a Depo-Medrol injection with good relief January of this year is requesting repeat injection  Assessment & Plan: Visit Diagnoses:  1. Bilateral primary osteoarthritis of knee   2. Impingement syndrome of left shoulder     Plan: Left knee left shoulder injected today without incident patient voiced immediate relief.  He will follow-up in the office as needed.  Follow-Up Instructions: Return if symptoms worsen or fail to improve.   Left Knee Exam   Muscle Strength  The patient has normal left knee strength.  Tenderness  The patient is experiencing tenderness in the medial joint line.  Range of Motion  The patient has normal left knee ROM.  Other  Erythema: absent Swelling: none   Left Shoulder Exam   Tenderness  The patient is experiencing tenderness in the biceps tendon.  Range of Motion  The patient has normal left  shoulder ROM.  Muscle Strength  The patient has normal left shoulder strength.  Tests  Impingement: positive Drop arm: negative      Patient is alert, oriented, no adenopathy, well-dressed, normal affect, normal respiratory effort.   Imaging: No results found. No images are attached to the encounter.  Labs: Lab Results  Component Value Date   HGBA1C 8.8 (H) 06/08/2019     Lab Results  Component Value Date   ALBUMIN 4.7 06/08/2019   ALBUMIN 4.6 02/15/2019   ALBUMIN 4.0 03/22/2014    No results found for: MG No results found for: VD25OH  No results found for: PREALBUMIN CBC EXTENDED Latest Ref Rng & Units 10/13/2019 06/08/2019 02/15/2019  WBC 4.0 - 10.5 K/uL 7.4 6.0 5.0  RBC 4.22 - 5.81 MIL/uL 5.15 5.30 5.08  HGB 13.0 - 17.0 g/dL 15.4 15.4 15.0  HCT 39 - 52 % 45.7 45.5 45.1  PLT 150 - 400 K/uL 288 235 301  NEUTROABS 1.7 - 7.7 K/uL - 2.9 -  LYMPHSABS 0.7 - 4.0 K/uL - 2.0 -     Body mass index is 28.13 kg/m.  Orders:  No orders of the defined types were placed in this encounter.  No orders of the defined types were placed in this encounter.    Procedures: Large  Joint Inj: L subacromial bursa on 11/24/2019 10:09 AM Indications: diagnostic evaluation and pain Details: 22 G 1.5 in needle  Arthrogram: No  Medications: 5 mL lidocaine 1 %; 40 mg methylPREDNISolone acetate 40 MG/ML Outcome: tolerated well, no immediate complications Procedure, treatment alternatives, risks and benefits explained, specific risks discussed. Consent was given by the patient. Immediately prior to procedure a time out was called to verify the correct patient, procedure, equipment, support staff and site/side marked as required. Patient was prepped and draped in the usual sterile fashion.   Large Joint Inj: L knee on 11/24/2019 10:09 AM Indications: pain Details: 18 G 1.5 in needle, anteromedial approach Medications: 5 mL lidocaine 1 %; 40 mg methylPREDNISolone acetate 40  MG/ML Consent was given by the patient.      Clinical Data: No additional findings.  ROS:  All other systems negative, except as noted in the HPI. Review of Systems  Constitutional: Negative for chills and fever.  Musculoskeletal: Positive for arthralgias and myalgias. Negative for joint swelling.    Objective: Vital Signs: Ht 5\' 8"  (1.727 m)   Wt 185 lb (83.9 kg)   BMI 28.13 kg/m   Specialty Comments:  No specialty comments available.  PMFS History: Patient Active Problem List   Diagnosis Date Noted  . Bilateral primary osteoarthritis of knee 04/22/2019  . Pain in left shoulder 04/22/2019  . FLATULENCE-GAS-BLOATING 11/14/2009  . PERSONAL HX COLON CANCER 11/14/2009   Past Medical History:  Diagnosis Date  . Cancer ([place])   . Diabetes mellitus without complication ([place])   . Hypertension     Family History  Problem Relation Age of Onset  . Colon cancer Neg Hx   . Esophageal cancer Neg Hx   . Rectal cancer Neg Hx   . Stomach cancer Neg Hx     Past Surgical History:  Procedure Laterality Date  . COLON SURGERY    . FINGER SURGERY     thumb surgery- September 2016  . ROTATOR CUFF REPAIR     Social History   Occupational History  . Not on file  Tobacco Use  . Smoking status: Former Smoker    Packs/day: 1.00    Years: 5.00    Pack years: 5.00    Types: Cigarettes    Quit date: 06/07/1973    Years since quitting: 46.4  . Smokeless tobacco: Never Used  Vaping Use  . Vaping Use: Never used  Substance and Sexual Activity  . Alcohol use: No  . Drug use: No  . Sexual activity: Not on file

## 2019-12-25 ENCOUNTER — Encounter: Payer: Self-pay | Admitting: Gastroenterology

## 2019-12-30 ENCOUNTER — Encounter: Payer: Self-pay | Admitting: Orthopaedic Surgery

## 2019-12-30 ENCOUNTER — Other Ambulatory Visit: Payer: Self-pay

## 2019-12-30 ENCOUNTER — Ambulatory Visit (INDEPENDENT_AMBULATORY_CARE_PROVIDER_SITE_OTHER): Payer: Medicare PPO | Admitting: Orthopaedic Surgery

## 2019-12-30 ENCOUNTER — Other Ambulatory Visit: Payer: Self-pay | Admitting: Orthopaedic Surgery

## 2019-12-30 VITALS — Ht 67.0 in | Wt 195.0 lb

## 2019-12-30 DIAGNOSIS — M542 Cervicalgia: Secondary | ICD-10-CM | POA: Diagnosis not present

## 2019-12-30 DIAGNOSIS — M25512 Pain in left shoulder: Secondary | ICD-10-CM

## 2019-12-30 DIAGNOSIS — G8929 Other chronic pain: Secondary | ICD-10-CM

## 2019-12-30 MED ORDER — DIAZEPAM 10 MG PO TABS
10.0000 mg | ORAL_TABLET | Freq: Once | ORAL | 0 refills | Status: AC
Start: 2019-12-30 — End: 2019-12-30
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
## 2020-01-25 ENCOUNTER — Encounter: Payer: Self-pay | Admitting: Internal Medicine

## 2020-01-28 ENCOUNTER — Ambulatory Visit
Admission: RE | Admit: 2020-01-28 | Discharge: 2020-01-28 | Disposition: A | Discharge status: Home / Self Care | Payer: Medicare PPO | Source: Ambulatory Visit | Attending: Orthopaedic Surgery | Admitting: Orthopaedic Surgery

## 2020-01-28 ENCOUNTER — Other Ambulatory Visit: Payer: Self-pay

## 2020-01-28 DIAGNOSIS — G8929 Other chronic pain: Secondary | ICD-10-CM

## 2020-01-28 DIAGNOSIS — M25512 Pain in left shoulder: Secondary | ICD-10-CM

## 2020-02-02 ENCOUNTER — Other Ambulatory Visit: Payer: Self-pay

## 2020-02-02 ENCOUNTER — Encounter: Payer: Self-pay | Admitting: Orthopaedic Surgery

## 2020-02-02 ENCOUNTER — Ambulatory Visit (INDEPENDENT_AMBULATORY_CARE_PROVIDER_SITE_OTHER): Payer: Medicare PPO | Admitting: Orthopaedic Surgery

## 2020-02-02 VITALS — Ht 67.0 in | Wt 195.0 lb

## 2020-02-02 DIAGNOSIS — M25512 Pain in left shoulder: Secondary | ICD-10-CM | POA: Diagnosis not present

## 2020-02-02 DIAGNOSIS — M170 Bilateral primary osteoarthritis of knee: Secondary | ICD-10-CM

## 2020-02-02 DIAGNOSIS — G8929 Other chronic pain: Secondary | ICD-10-CM | POA: Diagnosis not present

## 2020-02-02 MED ORDER — LIDOCAINE HCL 1 % IJ SOLN
2.0000 mL | INTRAMUSCULAR | Status: AC | PRN
  Administered 2020-02-02: 2 mL

## 2020-02-02 MED ORDER — BUPIVACAINE HCL 0.5 % IJ SOLN
2.0000 mL | INTRAMUSCULAR | Status: AC | PRN
  Administered 2020-02-02: 2 mL via INTRA_ARTICULAR
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
## 2020-02-23 ENCOUNTER — Other Ambulatory Visit: Payer: Self-pay

## 2020-02-23 ENCOUNTER — Telehealth: Payer: Self-pay

## 2020-02-23 DIAGNOSIS — G8929 Other chronic pain: Secondary | ICD-10-CM

## 2020-02-23 NOTE — Telephone Encounter (Signed)
Ok for PT left shoulder with RCT and biceps pathology

## 2020-02-23 NOTE — Telephone Encounter (Signed)
Patient called he stated his shoulder is feeling better, he is wondering if he can try out pt right now for his left shoulder before having the surgery. He still wants to have the surgery but he's trying to have a second opinion. Call [phone]

## 2020-02-23 NOTE — Telephone Encounter (Signed)
Please advise 

## 2020-02-23 NOTE — Telephone Encounter (Signed)
Ordered. Called patient. No answer. Left message on voicemail that PT has been ordered.

## 2020-03-14 ENCOUNTER — Ambulatory Visit (INDEPENDENT_AMBULATORY_CARE_PROVIDER_SITE_OTHER): Payer: Medicare PPO | Admitting: Physical Therapy

## 2020-03-14 ENCOUNTER — Other Ambulatory Visit: Payer: Self-pay

## 2020-03-14 ENCOUNTER — Encounter: Payer: Self-pay | Admitting: Physical Therapy

## 2020-03-14 DIAGNOSIS — M25612 Stiffness of left shoulder, not elsewhere classified: Secondary | ICD-10-CM | POA: Diagnosis not present

## 2020-03-14 DIAGNOSIS — R600 Localized edema: Secondary | ICD-10-CM

## 2020-03-14 DIAGNOSIS — M6281 Muscle weakness (generalized): Secondary | ICD-10-CM | POA: Diagnosis not present

## 2020-03-14 DIAGNOSIS — G8929 Other chronic pain: Secondary | ICD-10-CM

## 2020-03-14 DIAGNOSIS — M25512 Pain in left shoulder: Secondary | ICD-10-CM | POA: Diagnosis not present
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
## 2020-03-14 NOTE — Patient Instructions (Signed)
Access Code: [email or web address] URL: [email or web address] Date: 03/14/2020 Prepared by: Elsie Ra  Exercises Standing Shoulder Scaption Wall Walk - 1-2 x daily - 6 x weekly - 1 sets - 10 reps - 5 sec hold Standing Row with Anchored Resistance - 1-2 x daily - 6 x weekly - 2-3 sets - 10 reps Shoulder extension with resistance - Neutral - 1-2 x daily - 6 x weekly - 2-3 sets - 10 reps Standing Isometric Shoulder External Rotation with Doorway - 1-2 x daily - 6 x weekly - 1-2 sets - 10 reps - 5 hold Isometric Shoulder Abduction at Wall - 1-22 x daily - 6 x weekly - 1-2 sets - 10 reps Standing Isometric Shoulder Internal Rotation at Doorway - 1-2 x daily - 6 x weekly - 1-2 sets - 10 reps - 5 hold

## 2020-03-15 ENCOUNTER — Inpatient Hospital Stay: Payer: Medicare PPO | Admitting: Orthopaedic Surgery

## 2020-03-17 ENCOUNTER — Encounter: Payer: Medicare PPO | Admitting: Rehabilitative and Restorative Service Providers"

## 2020-03-21 ENCOUNTER — Encounter: Payer: Medicare PPO | Admitting: Physical Therapy

## 2020-03-22 ENCOUNTER — Other Ambulatory Visit: Payer: Self-pay

## 2020-03-22 ENCOUNTER — Ambulatory Visit (AMBULATORY_SURGERY_CENTER): Payer: Self-pay | Admitting: *Deleted

## 2020-03-22 VITALS — Ht 67.0 in | Wt 189.0 lb

## 2020-03-22 DIAGNOSIS — Z86010 Personal history of colonic polyps: Secondary | ICD-10-CM

## 2020-03-22 DIAGNOSIS — Z85038 Personal history of other malignant neoplasm of large intestine: Secondary | ICD-10-CM

## 2020-03-22 MED ORDER — SUPREP BOWEL PREP KIT 17.5-3.13-1.6 GM/177ML PO SOLN: 1.0000 | Freq: Once | ORAL | 0 refills | Status: AC

## 2020-03-22 NOTE — Progress Notes (Signed)
Fully vax'd plus booster   No egg or soy allergy known to patient  No issues with past sedation with any surgeries or procedures no intubation problems in the past  No FH of Malignant Hyperthermia No diet pills per patient No home 02 use per patient  No blood thinners per patient  Pt denies issues with constipation  No A fib or A flutter  EMMI video to pt or via [place] 19 guidelines implemented in PV today with Pt and RN   Due to the COVID-19 pandemic we are asking patients to follow these guidelines. Please only bring one care partner. Please be aware that your care partner may wait in the car in the parking lot or if they feel like they will be too hot to wait in the car, they may wait in the lobby on the 4th floor. All care partners are required to wear a mask the entire time (we do not have any that we can provide them), they need to practice social distancing, and we will do a Covid check for all patient's and care partners when you arrive. Also we will check their temperature and your temperature. If the care partner waits in their car they need to stay in the parking lot the entire time and we will call them on their cell phone when the patient is ready for discharge so they can bring the car to the front of the building. Also all patient's will need to wear a mask into building.

## 2020-03-24 ENCOUNTER — Encounter: Payer: Medicare PPO | Admitting: Rehabilitative and Restorative Service Providers"

## 2020-03-28 ENCOUNTER — Encounter: Payer: Medicare PPO | Admitting: Physical Therapy

## 2020-03-30 ENCOUNTER — Encounter: Payer: Medicare PPO | Admitting: Physical Therapy

## 2020-04-04 ENCOUNTER — Encounter: Payer: Medicare PPO | Admitting: Physical Therapy

## 2020-04-05 ENCOUNTER — Ambulatory Visit (AMBULATORY_SURGERY_CENTER): Payer: Medicare PPO | Admitting: Internal Medicine

## 2020-04-05 ENCOUNTER — Encounter: Payer: Self-pay | Admitting: Internal Medicine

## 2020-04-05 ENCOUNTER — Other Ambulatory Visit: Payer: Self-pay

## 2020-04-05 VITALS — BP 126/74 | HR 70 | Temp 97.7°F | Resp 14 | Ht 67.0 in | Wt 189.0 lb

## 2020-04-05 DIAGNOSIS — Z86010 Personal history of colonic polyps: Secondary | ICD-10-CM | POA: Diagnosis not present

## 2020-04-05 DIAGNOSIS — Z85038 Personal history of other malignant neoplasm of large intestine: Secondary | ICD-10-CM | POA: Diagnosis not present

## 2020-04-05 DIAGNOSIS — K635 Polyp of colon: Secondary | ICD-10-CM

## 2020-04-05 DIAGNOSIS — D125 Benign neoplasm of sigmoid colon: Secondary | ICD-10-CM

## 2020-04-05 MED ORDER — SODIUM CHLORIDE 0.9 % IV SOLN: 500.0000 mL | Freq: Once | INTRAVENOUS | Status: DC

## 2020-04-05 NOTE — Op Note (Signed)
[facility] Patient Name: Lance Crosby Procedure Date: 04/05/2020 8:37 AM MRN: [id number] Endoscopist: Docia Chuck. Henrene Pastor , MD Age: 74 Referring MD:  Date of Birth: [birth year] Gender: Male Account #: [id number] Procedure:                Colonoscopy with cold snare polypectomy x 1 Indications:              High risk colon cancer surveillance: Personal                            history of colon cancer. Malignant sigmoid colon                            polyp resected 2000. Multiple subsequent follow-up                            examinations. Most recent 2 examinations 2011, 2016                            with polyps. Medicines:                Monitored Anesthesia Care Procedure:                Pre-Anesthesia Assessment:                           - Prior to the procedure, a History and Physical                            was performed, and patient medications and                            allergies were reviewed. The patient's tolerance of                            previous anesthesia was also reviewed. The risks                            and benefits of the procedure and the sedation                            options and risks were discussed with the patient.                            All questions were answered, and informed consent                            was obtained. Prior Anticoagulants: The patient has                            taken no previous anticoagulant or antiplatelet                            agents. ASA Grade Assessment: II - A patient with  mild systemic disease. After reviewing the risks                            and benefits, the patient was deemed in                            satisfactory condition to undergo the procedure.                           After obtaining informed consent, the colonoscope                            was passed under direct vision. Throughout the                            procedure, the  patient's blood pressure, pulse, and                            oxygen saturations were monitored continuously. The                            Olympus CF-[id number]) Colonoscope was                            introduced through the anus and advanced to the the                            cecum, identified by appendiceal orifice and                            ileocecal valve. The ileocecal valve, appendiceal                            orifice, and rectum were photographed. The quality                            of the bowel preparation was excellent. The                            colonoscopy was performed without difficulty. The                            patient tolerated the procedure well. The bowel                            preparation used was SUPREP via split dose                            instruction. Scope In: 9:04:08 AM Scope Out: 9:17:40 AM Scope Withdrawal Time: 0 hours 10 minutes 13 seconds  Total Procedure Duration: 0 hours 13 minutes 32 seconds  Findings:                 A 3 mm polyp was found in the sigmoid colon. The  polyp was sessile. The polyp was removed with a                            cold snare. Resection and retrieval were complete.                           The exam was otherwise without abnormality on                            direct and retroflexion views. Unremarkable sigmoid                            colon anastomosis noted. Complications:            No immediate complications. Estimated blood loss:                            None. Estimated Blood Loss:     Estimated blood loss: none. Impression:               - One 3 mm polyp in the sigmoid colon, removed with                            a cold snare. Resected and retrieved.                           - The examination was otherwise normal on direct                            and retroflexion views. Recommendation:           - Repeat colonoscopy in 5 years for surveillance.                            - Patient has a contact number available for                            emergencies. The signs and symptoms of potential                            delayed complications were discussed with the                            patient. Return to normal activities tomorrow.                            Written discharge instructions were provided to the                            patient.                           - Resume previous diet.                           - Continue present medications.                           -  Await pathology results. Docia Chuck. Henrene Pastor, MD 04/05/2020 9:23:55 AM This report has been signed electronically.

## 2020-04-05 NOTE — Progress Notes (Signed)
Called to room to assist during endoscopic procedure.  Patient ID and intended procedure confirmed with present staff. Received instructions for my participation in the procedure from the performing physician.  

## 2020-04-05 NOTE — Progress Notes (Signed)
A/ox3, pleased with MAC, report to RN 

## 2020-04-05 NOTE — Progress Notes (Signed)
Pt's states no medical or surgical changes since previsit or office visit. 

## 2020-04-05 NOTE — Patient Instructions (Signed)
Thank you for allowing [place] to care for you today!  Await pathology of single polyp removed.  Recommend surveillance colonoscopy in 5 years.    Resume previous diet and medications today.  Resume normal activities tomorrow.     YOU HAD AN ENDOSCOPIC PROCEDURE TODAY AT [facility]:   Refer to the procedure report that was given to you for any specific questions about what was found during the examination.  If the procedure report does not answer your questions, please call your gastroenterologist to clarify.  If you requested that your care partner not be given the details of your procedure findings, then the procedure report has been included in a sealed envelope for you to review at your convenience later.  YOU SHOULD EXPECT: Some feelings of bloating in the abdomen. Passage of more gas than usual.  Walking can help get rid of the air that was put into your GI tract during the procedure and reduce the bloating. If you had a lower endoscopy (such as a colonoscopy or flexible sigmoidoscopy) you may notice spotting of blood in your stool or on the toilet paper. If you underwent a bowel prep for your procedure, you may not have a normal bowel movement for a few days.  Please Note:  You might notice some irritation and congestion in your nose or some drainage.  This is from the oxygen used during your procedure.  There is no need for concern and it should clear up in a day or so.  SYMPTOMS TO REPORT IMMEDIATELY:   Following lower endoscopy (colonoscopy or flexible sigmoidoscopy):  Excessive amounts of blood in the stool  Significant tenderness or worsening of abdominal pains  Swelling of the abdomen that is new, acute  Fever of 100F or higher   For urgent or emergent issues, a gastroenterologist can be reached at any hour by calling [phone]. Do not use MyChart messaging for urgent concerns.    DIET:  We do recommend a small meal at first, but then you may proceed to  your regular diet.  Drink plenty of fluids but you should avoid alcoholic beverages for 24 hours.  ACTIVITY:  You should plan to take it easy for the rest of today and you should NOT DRIVE or use heavy machinery until tomorrow (because of the sedation medicines used during the test).    FOLLOW UP: Our staff will call the number listed on your records 48-72 hours following your procedure to check on you and address any questions or concerns that you may have regarding the information given to you following your procedure. If we do not reach you, we will leave a message.  We will attempt to reach you two times.  During this call, we will ask if you have developed any symptoms of COVID 19. If you develop any symptoms (ie: fever, flu-like symptoms, shortness of breath, cough etc.) before then, please call [phone].  If you test positive for Covid 19 in the 2 weeks post procedure, please call and report this information to [place].    If any biopsies were taken you will be contacted by phone or by letter within the next 1-3 weeks.  Please call us at [phone] if you have not heard about the biopsies in 3 weeks.    SIGNATURES/CONFIDENTIALITY: You and/or your care partner have signed paperwork which will be entered into your electronic medical record.  These signatures attest to the fact that that the information above on your After  Visit Summary has been reviewed and is understood.  Full responsibility of the confidentiality of this discharge information lies with you and/or your care-partner. 

## 2020-04-06 ENCOUNTER — Encounter: Payer: Medicare PPO | Admitting: Physical Therapy

## 2020-04-07 ENCOUNTER — Telehealth: Payer: Self-pay | Admitting: *Deleted

## 2020-04-07 NOTE — Telephone Encounter (Signed)
Attempted f/u phone call. No answer. Left message. °

## 2020-04-07 NOTE — Telephone Encounter (Signed)
Pt states he is doing well. 

## 2020-04-11 ENCOUNTER — Encounter: Payer: Medicare PPO | Admitting: Physical Therapy

## 2020-04-13 ENCOUNTER — Encounter: Payer: Medicare PPO | Admitting: Physical Therapy

## 2020-04-18 ENCOUNTER — Telehealth: Payer: Self-pay | Admitting: Orthopaedic Surgery

## 2020-04-18 NOTE — Telephone Encounter (Signed)
Pt called stating he has been trying to reschedule his surgery and hasn't received a CB. Pt would really like to speak with Debbie as soon as she has a moment.   [phone]

## 2020-04-19 ENCOUNTER — Encounter: Payer: Self-pay | Admitting: Internal Medicine

## 2020-05-26 ENCOUNTER — Other Ambulatory Visit: Payer: Self-pay | Admitting: Orthopaedic Surgery

## 2020-05-26 DIAGNOSIS — M19012 Primary osteoarthritis, left shoulder: Secondary | ICD-10-CM

## 2020-05-26 DIAGNOSIS — S46122A Laceration of muscle, fascia and tendon of long head of biceps, left arm, initial encounter: Secondary | ICD-10-CM

## 2020-05-26 DIAGNOSIS — M7542 Impingement syndrome of left shoulder: Secondary | ICD-10-CM

## 2020-05-26 DIAGNOSIS — M75122 Complete rotator cuff tear or rupture of left shoulder, not specified as traumatic: Secondary | ICD-10-CM

## 2020-05-26 MED ORDER — OXYCODONE-ACETAMINOPHEN 5-325 MG PO TABS: 1.0000 | ORAL_TABLET | ORAL | 0 refills | Status: DC | PRN

## 2020-06-02 ENCOUNTER — Ambulatory Visit (INDEPENDENT_AMBULATORY_CARE_PROVIDER_SITE_OTHER): Payer: Medicare PPO | Admitting: Orthopedic Surgery

## 2020-06-02 ENCOUNTER — Other Ambulatory Visit: Payer: Self-pay

## 2020-06-02 ENCOUNTER — Inpatient Hospital Stay: Payer: Medicare PPO | Admitting: Orthopaedic Surgery

## 2020-06-02 DIAGNOSIS — Z9889 Other specified postprocedural states: Secondary | ICD-10-CM

## 2020-06-05 ENCOUNTER — Encounter: Payer: Self-pay | Admitting: Orthopedic Surgery

## 2020-06-05 NOTE — Progress Notes (Signed)
   Post-Op Visit Note   Patient: Lance Crosby           Date of Birth: 10/19/1945           MRN: [id number] Visit Date: 06/02/2020 PCP: Lance Cowman, MD   Assessment & Plan:  Chief Complaint:  Chief Complaint  Patient presents with  . Left Shoulder - Routine Post Op   Visit Diagnoses:  1. S/P arthroscopy of left shoulder     Plan: Lance Crosby is a patient who underwent left shoulder arthroscopy 05/26/2020.  He has been in a sling.  On exam his get intact incisions deltoid fires like him to start therapy here 3 times a week for 4 weeks for passive range of motion and active assistive range of motion.  Follow-up with Dr. Durward Crosby in 2 weeks.  No lifting with left arm.  Continue sling until he sees Dr. Durward Crosby except when he is doing exercises.  He would like to get a hinged knee brace as well and that is provided.  Follow-Up Instructions: Return in about 2 weeks (around 06/16/2020).   Orders:  Orders Placed This Encounter  Procedures  . Ambulatory referral to Physical Therapy   No orders of the defined types were placed in this encounter.   Imaging: No results found.  PMFS History: Patient Active Problem List   Diagnosis Date Noted  . Cervicalgia 12/30/2019  . Bilateral primary osteoarthritis of knee 04/22/2019  . Pain in left shoulder 04/22/2019  . FLATULENCE-GAS-BLOATING 11/14/2009  . PERSONAL HX COLON CANCER 11/14/2009   Past Medical History:  Diagnosis Date  . Arthritis   . Claustrophobia   . Colon cancer ([place]) 2000   colon cancer  . Diabetes mellitus without complication ([place])   . GERD (gastroesophageal reflux disease)    if eats late   . Hypertension     Family History  Problem Relation Age of Onset  . Colon cancer Neg Hx   . Esophageal cancer Neg Hx   . Rectal cancer Neg Hx   . Stomach cancer Neg Hx   . Colon polyps Neg Hx     Past Surgical History:  Procedure Laterality Date  . COLON SURGERY  02/1999  . COLONOSCOPY    . FINGER SURGERY      thumb surgery- September 2016  . KNEE SURGERY    . POLYPECTOMY    . ROTATOR CUFF REPAIR     Social History   Occupational History  . Not on file  Tobacco Use  . Smoking status: Former Smoker    Packs/day: 1.00    Years: 5.00    Pack years: 5.00    Types: Cigarettes    Quit date: 06/07/1973    Years since quitting: 47.0  . Smokeless tobacco: Never Used  Vaping Use  . Vaping Use: Never used  Substance and Sexual Activity  . Alcohol use: No  . Drug use: No  . Sexual activity: Not on file

## 2020-06-10 ENCOUNTER — Other Ambulatory Visit: Payer: Self-pay

## 2020-06-10 ENCOUNTER — Encounter: Payer: Self-pay | Admitting: Rehabilitative and Restorative Service Providers"

## 2020-06-10 ENCOUNTER — Ambulatory Visit (INDEPENDENT_AMBULATORY_CARE_PROVIDER_SITE_OTHER): Payer: Medicare PPO | Admitting: Rehabilitative and Restorative Service Providers"

## 2020-06-10 DIAGNOSIS — M25512 Pain in left shoulder: Secondary | ICD-10-CM

## 2020-06-10 DIAGNOSIS — G8929 Other chronic pain: Secondary | ICD-10-CM

## 2020-06-10 DIAGNOSIS — M25612 Stiffness of left shoulder, not elsewhere classified: Secondary | ICD-10-CM | POA: Diagnosis not present

## 2020-06-10 DIAGNOSIS — M6281 Muscle weakness (generalized): Secondary | ICD-10-CM

## 2020-06-10 NOTE — Therapy (Signed)
[facility] [address] [place], [place], [address] Phone: [phone]   Fax:  [phone]  Physical Therapy Evaluation  Patient Details  Name: Lance Crosby MRN: [id number] Date of Birth: May 10, 1945 Referring Provider (PT): Garald Balding MD  Referring diagnosis? M75.42  M19.012  M75.102  M12.812 Treatment diagnosis? (if different than referring diagnosis) M62.81  M25.612  M25.512 What was this (referring dx) caused by? [x]  Surgery []  Fall []  Ongoing issue [x]  Arthritis []  Other: ____________  Laterality: []  Rt [x]  Lt []  Both  Check all possible CPT codes:      [x]  97110 (Therapeutic Exercise)  []  92507 (SLP Treatment)  [x]  24401 (Neuro Re-ed)   []  92526 (Swallowing Treatment)   []  97116 (Gait Training)   []  [id number] (Cognitive Training, 1st 15 minutes) [x]  97140 (Manual Therapy)   []  97130 (Cognitive Training, each add'l 15 minutes)  [x]  97530 (Therapeutic Activities)  []  Other, List CPT Code ____________    [x]  02725 (Self Care)       []  All codes above (97110 - 97535)  []  97012 (Mechanical Traction)  []  97014 (E-stim Unattended)  []  97032 (E-stim manual)  []  97033 (Ionto)  []  97035 (Ultrasound)  []  97760 (Orthotic Fit) [x]  [id number] (Physical Performance Training) []  [id number] (Aquatic Therapy) []  97034 (Contrast Bath) []  [id number] (Paraffin) []  97597 (Wound Care 1st 20 sq cm) []  97598 (Wound Care each add'l 20 sq cm) [x]  97016 (Vasopneumatic Device) []  [id number] Comptroller) []  [id number] (Prosthetic Training)   Encounter Date: 06/10/2020   PT End of Session - 06/10/20 1754    Visit Number 1    Number of Visits 20    Date for PT Re-Evaluation 07/22/20    PT Start Time 0851    PT Stop Time 0929    PT Time Calculation (min) 38 min    Activity Tolerance Patient tolerated treatment well    Behavior During Therapy [facility] for tasks assessed/performed           Past Medical History:  Diagnosis Date  . Arthritis   .  Claustrophobia   . Colon cancer ([place]) 2000   colon cancer  . Diabetes mellitus without complication ([place])   . GERD (gastroesophageal reflux disease)    if eats late   . Hypertension     Past Surgical History:  Procedure Laterality Date  . COLON SURGERY  02/1999  . COLONOSCOPY    . FINGER SURGERY     thumb surgery- September 2016  . KNEE SURGERY    . POLYPECTOMY    . ROTATOR CUFF REPAIR      There were no vitals filed for this visit.    Subjective Assessment - 06/10/20 1751    Subjective Jihaad states he had a L shoulder RTC repair and biceps tenodesis on 05/17/2020.    Pertinent History Cancer survivor, HTN, DM    Limitations Other (comment)   L UE function and sleeping   Patient Stated Goals Be able to sleep normally and use his L shoulder normally    Currently in Pain? Yes    Pain Score 5     Pain Location Shoulder    Pain Orientation Left    Pain Descriptors / Indicators Aching;Tightness;Sore    Pain Type Surgical pain;Chronic pain    Pain Onset 1 to 4 weeks ago    Pain Frequency Constant    Aggravating Factors  Sleeping    Effect of Pain on Daily Activities Affects sleep quality and all L  UE use    Multiple Pain Sites No              [facility] PT Assessment - 06/10/20 0001      Assessment   Medical Diagnosis s/p arthroscopy of L shoulder    Referring Provider (PT) Garald Balding MD    Onset Date/Surgical Date 05/17/20    Next MD Visit 06/16/2020      Precautions   Required Braces or Orthoses Sling   Not wearing sling today, recommended he return to sling at least until consulting Dr. Marlou Sa next week     Balance Screen   Has the patient fallen in the past 6 months No    Has the patient had a decrease in activity level because of a fear of falling?  No    Is the patient reluctant to leave their home because of a fear of falling?  No      Prior Function   Level of Independence Independent    Vocation Full time employment    Set designer     Leisure TV      Cognition   Overall Cognitive Status Within Functional Limits for tasks assessed      Observation/Other Assessments   Focus on Therapeutic Outcomes (FOTO)  37 (Goal 64)      ROM / Strength   AROM / PROM / Strength PROM      PROM   Overall PROM  Deficits    PROM Assessment Site Shoulder    Right/Left Shoulder Left;Right    Right Shoulder Flexion 165 Degrees    Right Shoulder Internal Rotation 55 Degrees    Right Shoulder External Rotation 65 Degrees    Left Shoulder Flexion 110 Degrees    Left Shoulder Internal Rotation 50 Degrees    Left Shoulder External Rotation 30 Degrees                      Objective measurements completed on examination: See above findings.       [facility] Adult PT Treatment/Exercise - 06/10/20 0001      Therapeutic Activites    Therapeutic Activities Other Therapeutic Activities    Other Therapeutic Activities Answered questions about sling use, sleeping recommendations, phases of rehabilitation and expected duration (8-10 weeks) and outcomes (90% AROM and 60% strength at 12 weeks post-surgery)      Exercises   Exercises Shoulder      Shoulder Exercises: Standing   Other Standing Exercises Codmans Pendulum 30X each F/B; CW; CCW                  PT Education - 06/10/20 1754    Education Details Answered questions about shoulder anatomy, PT phases and expected outcomes.  Started home exercises.    Person(s) Educated Patient    Methods Explanation;Demonstration;Tactile cues;Verbal cues;Handout    Comprehension Verbalized understanding;Tactile cues required;Need further instruction;Returned demonstration;Verbal cues required            PT Short Term Goals - 06/10/20 1800      PT SHORT TERM GOAL #1   Title Improve L shoulder PROM for flexion to 135; IR to 60; ER to 70 degrees    Baseline See objective    Time 4    Period Weeks    Status New    Target Date 07/08/20             PT Long Term Goals -  06/10/20 1801  PT LONG TERM GOAL #1   Title Improve FOTO score to 64    Baseline 37    Time 10    Period Weeks    Status New    Target Date 08/19/20      PT LONG TERM GOAL #2   Title Improve L shoulder AROM to 90% of: Flexion 170; IR 60; ER 90; HA 40 degrees    Baseline See objective    Time 10    Period Weeks    Status New    Target Date 08/19/20      PT LONG TERM GOAL #3   Title Abdul will be able to reach overhead and behind his back without complaints of pain > 2/10 o functional difficulty at DC    Baseline Unable    Time 10    Period Weeks    Status New    Target Date 08/19/20      PT LONG TERM GOAL #4   Title Syrus will report L shoulder pain consistently 0-3/10 on the Numeric Pain Rating Scale    Baseline Can be 5+/10    Time 10    Period Weeks    Status New    Target Date 08/19/20      PT LONG TERM GOAL #5   Title Piercen will be independent with his HEP at DC    Time [place]    Target Date 08/19/20                  Plan - 06/10/20 1756    Clinical Impression Statement Juanda Crumble had a L RTC repair and biceps tenodesis on 05/18/2020.  PROM will be the early emphasis.  Of some concern, Trevon reported to PT today without his sling.  We reviewed the importance of following MD orders on using the sling at least up until his follow-up with Dr. Marlou Sa next week.  Progressions to [facility] and AROM as appropriate with strengthening and functional activities as healing occurs.    Personal Factors and Comorbidities Comorbidity 1    Comorbidities HTN, DM, cancer survivor    Examination-Activity Limitations Bathing;Dressing;Bed Mobility;Hygiene/Grooming;Sleep;Reach Overhead;Carry    Examination-Participation Restrictions Church;Interpersonal Relationship;Occupation;Community Activity    Stability/Clinical Decision Making Stable/Uncomplicated    Clinical Decision Making Low    Rehab Potential Good    PT Frequency 2x / week    PT  Duration Other (comment)   10 weeks   PT Treatment/Interventions ADLs/Self Care Home Management;Moist Heat;Cryotherapy;Therapeutic activities;Therapeutic exercise;Neuromuscular re-education;Patient/family education;Manual techniques;Passive range of motion;Vasopneumatic Device    PT Next Visit Plan PROM    PT Home Exercise Plan Access Code: [email or web address]    Consulted and Agree with Plan of Care Patient           Patient will benefit from skilled therapeutic intervention in order to improve the following deficits and impairments:  Decreased activity tolerance,Decreased endurance,Decreased range of motion,Decreased strength,Increased edema,Impaired flexibility,Impaired UE functional use,Postural dysfunction,Pain  Visit Diagnosis: Muscle weakness (generalized)  Stiffness of left shoulder, not elsewhere classified  Chronic left shoulder pain     Problem List Patient Active Problem List   Diagnosis Date Noted  . Cervicalgia 12/30/2019  . Bilateral primary osteoarthritis of knee 04/22/2019  . Pain in left shoulder 04/22/2019  . FLATULENCE-GAS-BLOATING 11/14/2009  . PERSONAL HX COLON CANCER 11/14/2009    Farley Ly PT, MPT 06/10/2020, 6:06 PM  [facility] Physical Therapy [address] [place], [place], [address] Phone:  [phone]   Fax:  [phone]  Name: LAKYN MANTIONE MRN: [id number] Date of Birth: [birth year]

## 2020-06-10 NOTE — Patient Instructions (Signed)
Access Code: [email or web address] URL: [email or web address] Date: 06/10/2020 Prepared by: Vista Mink  Exercises Horizontal Shoulder Pendulum with Table Support - 5 x daily - 7 x weekly - 1 sets - 20-30 reps

## 2020-06-14 ENCOUNTER — Ambulatory Visit (INDEPENDENT_AMBULATORY_CARE_PROVIDER_SITE_OTHER): Payer: Medicare PPO | Admitting: Rehabilitative and Restorative Service Providers"

## 2020-06-14 ENCOUNTER — Other Ambulatory Visit: Payer: Self-pay

## 2020-06-14 ENCOUNTER — Encounter: Payer: Self-pay | Admitting: Rehabilitative and Restorative Service Providers"

## 2020-06-14 DIAGNOSIS — M25612 Stiffness of left shoulder, not elsewhere classified: Secondary | ICD-10-CM | POA: Diagnosis not present

## 2020-06-14 DIAGNOSIS — M25512 Pain in left shoulder: Secondary | ICD-10-CM | POA: Diagnosis not present

## 2020-06-14 DIAGNOSIS — G8929 Other chronic pain: Secondary | ICD-10-CM

## 2020-06-14 DIAGNOSIS — M6281 Muscle weakness (generalized): Secondary | ICD-10-CM

## 2020-06-14 DIAGNOSIS — R600 Localized edema: Secondary | ICD-10-CM | POA: Diagnosis not present

## 2020-06-14 NOTE — Therapy (Signed)
[facility] Therapy [address] [place], [place], [address] Phone: [phone]   Fax:  [phone]  Physical Therapy Treatment  Patient Details  Name: Lance Crosby MRN: [id number] Date of Birth: March 12, 1946 Referring Provider (PT): Garald Balding MD   Encounter Date: 06/14/2020   PT End of Session - 06/14/20 1626    Visit Number 2    Number of Visits 20    Date for PT Re-Evaluation 07/22/20    PT Start Time 1601    PT Stop Time 1640    PT Time Calculation (min) 39 min    Activity Tolerance Patient tolerated treatment well    Behavior During Therapy [facility] for tasks assessed/performed           Past Medical History:  Diagnosis Date  . Arthritis   . Claustrophobia   . Colon cancer ([place]) 2000   colon cancer  . Diabetes mellitus without complication ([place])   . GERD (gastroesophageal reflux disease)    if eats late   . Hypertension     Past Surgical History:  Procedure Laterality Date  . COLON SURGERY  02/1999  . COLONOSCOPY    . FINGER SURGERY     thumb surgery- September 2016  . KNEE SURGERY    . POLYPECTOMY    . ROTATOR CUFF REPAIR      There were no vitals filed for this visit.   Subjective Assessment - 06/14/20 1605    Subjective Pt. indicated some sore here and there.  No specificly painful.    Pertinent History Cancer survivor, HTN, DM    Limitations Other (comment)   L UE function and sleeping   Patient Stated Goals Be able to sleep normally and use his L shoulder normally    Currently in Pain? Yes    Pain Score 6     Pain Location Shoulder    Pain Orientation Left    Pain Descriptors / Indicators Sore;Aching    Pain Type Surgical pain;Chronic pain    Pain Onset 1 to 4 weeks ago    Aggravating Factors  general soreness, some nighttime pain    Pain Relieving Factors resting arm                             [facility] Adult PT Treatment/Exercise - 06/14/20 0001      Shoulder Exercises: Supine    Protraction Both   1 lb bar 5 sec hold x 10 (AAROM)   Other Supine Exercises supine AAROM wand flexion 1 lb x 10, supine wand ER in 30 deg abd 5 sec hold x 10      Shoulder Exercises: Seated   Other Seated Exercises scap retraction 5 sec hold x 10      Shoulder Exercises: Standing   Other Standing Exercises Codmans Pendulum 30X each F/B; CW; CCW      Manual Therapy   Manual therapy comments g2-g3 inferior jt obs, ap mobs, PROM all directions Lt Gh jt, gentle distraction                    PT Short Term Goals - 06/14/20 1620      PT SHORT TERM GOAL #1   Title Improve L shoulder PROM for flexion to 135; IR to 60; ER to 70 degrees    Baseline See objective    Time 4    Period Weeks    Status On-going  Target Date 07/08/20             PT Long Term Goals - 06/10/20 1801      PT LONG TERM GOAL #1   Title Improve FOTO score to 64    Baseline 37    Time 10    Period Weeks    Status New    Target Date 08/19/20      PT LONG TERM GOAL #2   Title Improve L shoulder AROM to 90% of: Flexion 170; IR 60; ER 90; HA 40 degrees    Baseline See objective    Time 10    Period Weeks    Status New    Target Date 08/19/20      PT LONG TERM GOAL #3   Title [name] will be able to reach overhead and behind his back without complaints of pain > 2/10 o functional difficulty at DC    Baseline Unable    Time 10    Period Weeks    Status New    Target Date 08/19/20      PT LONG TERM GOAL #4   Title [name] will report L shoulder pain consistently 0-3/10 on the Numeric Pain Rating Scale    Baseline Can be 5+/10    Time 10    Period Weeks    Status New    Target Date 08/19/20      PT LONG TERM GOAL #5   Title [name] will be independent with his HEP at DC    Time 10    Period Weeks    Status New    Target Date 08/19/20                 Plan - 06/14/20 1620    Clinical Impression Statement Pt. presented c mild muscle guarding in supine passive elevation  movements but improved c treatment time in clinic c good range gains noted.  ER limitation noted c mixture of pain, guarding and capsule tightness.  Progressed HEP to include PROM, AAROM intervention due to good tolerance in clinic.    Personal Factors and Comorbidities Comorbidity 1    Comorbidities HTN, DM, cancer survivor    Examination-Activity Limitations Bathing;Dressing;Bed Mobility;Hygiene/Grooming;Sleep;Reach Overhead;Carry    Examination-Participation Restrictions Church;Interpersonal Relationship;Occupation;Community Activity    Stability/Clinical Decision Making Stable/Uncomplicated    Rehab Potential Good    PT Frequency 2x / week    PT Duration Other (comment)   10 weeks   PT Treatment/Interventions ADLs/Self Care Home Management;Moist Heat;Cryotherapy;Therapeutic activities;Therapeutic exercise;Neuromuscular re-education;Patient/family education;Manual techniques;Passive range of motion;Vasopneumatic Device    PT Next Visit Plan (MD referral indicated PROM, AAROM at this time ) Continue prom, manual mobilizations for mobility, AAROM continued per Pt. response.    PT Home Exercise Plan Access Code: [email or web address]    Consulted and Agree with Plan of Care Patient           Patient will benefit from skilled therapeutic intervention in order to improve the following deficits and impairments:  Decreased activity tolerance,Decreased endurance,Decreased range of motion,Decreased strength,Increased edema,Impaired flexibility,Impaired UE functional use,Postural dysfunction,Pain  Visit Diagnosis: Chronic left shoulder pain  Stiffness of left shoulder, not elsewhere classified  Muscle weakness (generalized)  Localized edema     Problem List Patient Active Problem List   Diagnosis Date Noted  . Cervicalgia 12/30/2019  . Bilateral primary osteoarthritis of knee 04/22/2019  . Pain in left shoulder 04/22/2019  . FLATULENCE-GAS-BLOATING 11/14/2009  . PERSONAL HX COLON CANCER 11/14/2009    Lance Crosby  Elmer Ramp, DPT, OCS, ATC 06/14/20  4:29 PM    [facility] [address] [place], [place], [address] Phone: [phone]   Fax:  [phone]  Name: Lance Crosby MRN: [id number] Date of Birth: [birth year]

## 2020-06-16 ENCOUNTER — Encounter: Payer: Self-pay | Admitting: Orthopedic Surgery

## 2020-06-16 ENCOUNTER — Telehealth: Payer: Self-pay

## 2020-06-16 ENCOUNTER — Ambulatory Visit (INDEPENDENT_AMBULATORY_CARE_PROVIDER_SITE_OTHER): Payer: Medicare PPO | Admitting: Orthopedic Surgery

## 2020-06-16 DIAGNOSIS — Z9889 Other specified postprocedural states: Secondary | ICD-10-CM

## 2020-06-16 MED ORDER — HYDROCODONE-ACETAMINOPHEN 5-325 MG PO TABS: 1.0000 | ORAL_TABLET | Freq: Three times a day (TID) | ORAL | 0 refills | Status: DC | PRN

## 2020-06-16 NOTE — Telephone Encounter (Signed)
Patient had surgery with Dr Durward Fortes and has seen Dr Marlou Sa for his last 2 post op visits. Dr Marlou Sa would like patient to follow up with Dr Durward Fortes in 3 weeks. Please schedule appt. Thanks.

## 2020-06-16 NOTE — Progress Notes (Signed)
   Post-Op Visit Note   Patient: Lance Crosby           Date of Birth: 1945/11/18           MRN: [id number] Visit Date: 06/16/2020 PCP: Kristie Cowman, MD   Assessment & Plan:  Chief Complaint:  Chief Complaint  Patient presents with  . Left Shoulder - Follow-up   Visit Diagnoses:  1. S/P arthroscopy of left shoulder     Plan: Hersel is now 3 weeks out left shoulder arthroscopy done by Dr. Durward Fortes.  Overall he is making good progress.  He is in physical therapy.  On exam he has about 10 degrees of external rotation at 15 degrees of abduction.  Forward flexion passively and abduction passively both approaching 90.  Rotator cuff strength pretty reasonable.  Plan at this time is to discontinue sling but no lifting with that left arm.  Needs to follow-up with Dr. Durward Fortes in 3 weeks.  Continue with therapy visits of which he has 9 remaining..  Follow-Up Instructions: No follow-ups on file.   Orders:  No orders of the defined types were placed in this encounter.  No orders of the defined types were placed in this encounter.   Imaging: No results found.  PMFS History: Patient Active Problem List   Diagnosis Date Noted  . Cervicalgia 12/30/2019  . Bilateral primary osteoarthritis of knee 04/22/2019  . Pain in left shoulder 04/22/2019  . FLATULENCE-GAS-BLOATING 11/14/2009  . PERSONAL HX COLON CANCER 11/14/2009   Past Medical History:  Diagnosis Date  . Arthritis   . Claustrophobia   . Colon cancer ([place]) 2000   colon cancer  . Diabetes mellitus without complication ([place])   . GERD (gastroesophageal reflux disease)    if eats late   . Hypertension     Family History  Problem Relation Age of Onset  . Colon cancer Neg Hx   . Esophageal cancer Neg Hx   . Rectal cancer Neg Hx   . Stomach cancer Neg Hx   . Colon polyps Neg Hx     Past Surgical History:  Procedure Laterality Date  . COLON SURGERY  02/1999  . COLONOSCOPY    . FINGER SURGERY     thumb surgery-  September 2016  . KNEE SURGERY    . POLYPECTOMY    . ROTATOR CUFF REPAIR     Social History   Occupational History  . Not on file  Tobacco Use  . Smoking status: Former Smoker    Packs/day: 1.00    Years: 5.00    Pack years: 5.00    Types: Cigarettes    Quit date: 06/07/1973    Years since quitting: 47.0  . Smokeless tobacco: Never Used  Vaping Use  . Vaping Use: Never used  Substance and Sexual Activity  . Alcohol use: No  . Drug use: No  . Sexual activity: Not on file

## 2020-06-16 NOTE — Addendum Note (Signed)
Addended by: Marcene Duos on: 06/16/2020 09:21 AM   Modules accepted: Orders

## 2020-06-22 ENCOUNTER — Encounter: Payer: Medicare PPO | Admitting: Rehabilitative and Restorative Service Providers"

## 2020-06-22 ENCOUNTER — Ambulatory Visit (INDEPENDENT_AMBULATORY_CARE_PROVIDER_SITE_OTHER): Payer: Medicare PPO | Admitting: Rehabilitative and Restorative Service Providers"

## 2020-06-22 ENCOUNTER — Encounter: Payer: Self-pay | Admitting: Rehabilitative and Restorative Service Providers"

## 2020-06-22 ENCOUNTER — Other Ambulatory Visit: Payer: Self-pay

## 2020-06-22 DIAGNOSIS — M25512 Pain in left shoulder: Secondary | ICD-10-CM

## 2020-06-22 DIAGNOSIS — R600 Localized edema: Secondary | ICD-10-CM

## 2020-06-22 DIAGNOSIS — G8929 Other chronic pain: Secondary | ICD-10-CM

## 2020-06-22 DIAGNOSIS — M6281 Muscle weakness (generalized): Secondary | ICD-10-CM

## 2020-06-22 DIAGNOSIS — M25612 Stiffness of left shoulder, not elsewhere classified: Secondary | ICD-10-CM

## 2020-06-22 NOTE — Therapy (Signed)
[facility] [address] [place], [place], [address] Phone: [phone]   Fax:  [phone]  Physical Therapy Treatment  Patient Details  Name: Lance Crosby MRN: [id number] Date of Birth: 1945-12-17 Referring Provider (PT): Garald Balding MD   Encounter Date: 06/22/2020   PT End of Session - 06/22/20 1435    Visit Number 3    Number of Visits 20    Date for PT Re-Evaluation 07/22/20    Authorization Type 12 visits through 08/19/2020    Authorization - Visit Number 3    Authorization - Number of Visits 12    Progress Note Due on Visit 10    PT Start Time 1093    PT Stop Time 1510    PT Time Calculation (min) 39 min    Activity Tolerance Patient tolerated treatment well    Behavior During Therapy [facility] for tasks assessed/performed           Past Medical History:  Diagnosis Date  . Arthritis   . Claustrophobia   . Colon cancer ([place]) 2000   colon cancer  . Diabetes mellitus without complication ([place])   . GERD (gastroesophageal reflux disease)    if eats late   . Hypertension     Past Surgical History:  Procedure Laterality Date  . COLON SURGERY  02/1999  . COLONOSCOPY    . FINGER SURGERY     thumb surgery- September 2016  . KNEE SURGERY    . POLYPECTOMY    . ROTATOR CUFF REPAIR      There were no vitals filed for this visit.   Subjective Assessment - 06/22/20 1435    Subjective Pt. indicated seeing MD with good report overall.  No pain upon arrival today was indicated.    Pertinent History Cancer survivor, HTN, DM    Limitations Other (comment)   L UE function and sleeping   Patient Stated Goals Be able to sleep normally and use his L shoulder normally    Currently in Pain? No/denies    Pain Score 0-No pain    Pain Onset 1 to 4 weeks ago              [facility] PT Assessment - 06/22/20 0001      PROM   Left Shoulder Flexion 140 Degrees   supine   Left Shoulder ABduction 110 Degrees   supine   Left Shoulder  Internal Rotation 68 Degrees   in 45 deg abduction   Left Shoulder External Rotation 45 Degrees   in 45 deg abd supine                        [facility] Adult PT Treatment/Exercise - 06/22/20 0001      Shoulder Exercises: Supine   Protraction Both;AAROM   1 lb bar for AAROM 5 sec hold x 10   Other Supine Exercises supine AAROM wand flexion 1 lb x 15, supine wand ER in 30 deg abd 5 sec hold x 10      Manual Therapy   Manual therapy comments g3 ap Lt GH jt mobs in er/ir in 45 deg abd, inferior g2 mobs in flexion, scaption, prom                    PT Short Term Goals - 06/14/20 1620      PT SHORT TERM GOAL #1   Title Improve L shoulder PROM for flexion to 135; IR to 60;  ER to 70 degrees    Baseline See objective    Time 4    Period Weeks    Status On-going    Target Date 07/08/20             PT Long Term Goals - 06/10/20 1801      PT LONG TERM GOAL #1   Title Improve FOTO score to 64    Baseline 37    Time 10    Period Weeks    Status New    Target Date 08/19/20      PT LONG TERM GOAL #2   Title Improve L shoulder AROM to 90% of: Flexion 170; IR 60; ER 90; HA 40 degrees    Baseline See objective    Time 10    Period Weeks    Status New    Target Date 08/19/20      PT LONG TERM GOAL #3   Title [name] will be able to reach overhead and behind his back without complaints of pain > 2/10 o functional difficulty at DC    Baseline Unable    Time 10    Period Weeks    Status New    Target Date 08/19/20      PT LONG TERM GOAL #4   Title [name] will report L shoulder pain consistently 0-3/10 on the Numeric Pain Rating Scale    Baseline Can be 5+/10    Time 10    Period Weeks    Status New    Target Date 08/19/20      PT LONG TERM GOAL #5   Title [name] will be independent with his HEP at DC    Time 10    Period Weeks    Status New    Target Date 08/19/20                 Plan - 06/22/20 1449    Clinical Impression Statement  Flexion, scaption movement improved greatly c reduced guarding, mild end range tightness noted.  ER guarding more noted, as well as AP glide restriction.  Overall PROM numbers improved compared to evaluation.  Continued progress at this time within [facility] protocol per MD.    Personal Factors and Comorbidities Comorbidity 1    Comorbidities HTN, DM, cancer survivor    Examination-Activity Limitations Bathing;Dressing;Bed Mobility;Hygiene/Grooming;Sleep;Reach Overhead;Carry    Examination-Participation Restrictions Church;Interpersonal Relationship;Occupation;Community Activity    Stability/Clinical Decision Making Stable/Uncomplicated    Rehab Potential Good    PT Frequency 2x / week    PT Duration Other (comment)   10 weeks   PT Treatment/Interventions ADLs/Self Care Home Management;Moist Heat;Cryotherapy;Therapeutic activities;Therapeutic exercise;Neuromuscular re-education;Patient/family education;Manual techniques;Passive range of motion;Vasopneumatic Device    PT Next Visit Plan (MD referral indicated PROM, AAROM at this time through 06/30/2020 ) Continue prom, manual mobilizations for mobility, AAROM continued per Pt. response.    PT Home Exercise Plan Access Code: [email or web address]    Consulted and Agree with Plan of Care Patient           Patient will benefit from skilled therapeutic intervention in order to improve the following deficits and impairments:  Decreased activity tolerance,Decreased endurance,Decreased range of motion,Decreased strength,Increased edema,Impaired flexibility,Impaired UE functional use,Postural dysfunction,Pain  Visit Diagnosis: Chronic left shoulder pain  Stiffness of left shoulder, not elsewhere classified  Muscle weakness (generalized)  Localized edema     Problem List Patient Active Problem List   Diagnosis Date Noted  . Cervicalgia 12/30/2019  . Bilateral primary osteoarthritis of  knee 04/22/2019  . Pain in left shoulder 04/22/2019  .  FLATULENCE-GAS-BLOATING 11/14/2009  . PERSONAL HX COLON CANCER 11/14/2009    Scot Jun, PT, DPT, OCS, ATC 06/22/20  3:01 PM    [facility] [address] [place], [place], [address] Phone: [phone]   Fax:  [phone]  Name: Lance Crosby MRN: [id number] Date of Birth: 03/04/1946

## 2020-06-24 ENCOUNTER — Other Ambulatory Visit: Payer: Self-pay

## 2020-06-24 ENCOUNTER — Encounter: Payer: Self-pay | Admitting: Rehabilitative and Restorative Service Providers"

## 2020-06-24 ENCOUNTER — Ambulatory Visit (INDEPENDENT_AMBULATORY_CARE_PROVIDER_SITE_OTHER): Payer: Medicare PPO | Admitting: Rehabilitative and Restorative Service Providers"

## 2020-06-24 DIAGNOSIS — M25612 Stiffness of left shoulder, not elsewhere classified: Secondary | ICD-10-CM

## 2020-06-24 DIAGNOSIS — M6281 Muscle weakness (generalized): Secondary | ICD-10-CM | POA: Diagnosis not present

## 2020-06-24 DIAGNOSIS — M25512 Pain in left shoulder: Secondary | ICD-10-CM

## 2020-06-24 DIAGNOSIS — R600 Localized edema: Secondary | ICD-10-CM

## 2020-06-24 DIAGNOSIS — G8929 Other chronic pain: Secondary | ICD-10-CM

## 2020-06-24 NOTE — Patient Instructions (Signed)
Access Code: [email or web address] URL: [email or web address] Date: 06/24/2020 Prepared by: Vista Mink  Exercises Horizontal Shoulder Pendulum with Table Support - 3-5 x daily - 7 x weekly - 1 sets - 20-30 reps Supine Scapular Protraction in Flexion with Dumbbells - 2 x daily - 7 x weekly - 1 sets - 10 reps - 3 second hold Seated Scapular Retraction - 5 x daily - 7 x weekly - 1 sets - 5 reps - 5 seconds hold Supine Shoulder Internal Rotation Stretch - 2 x daily - 7 x weekly - 1 sets - 10-20 reps - 10 secondsinutes hold Supine Shoulder External Rotation Stretch - 2 x daily - 7 x weekly - 1 sets - 10-20 reps - 10 seconds hold

## 2020-06-24 NOTE — Therapy (Signed)
[facility] Therapy [address] [place], [place], [address] Phone: [phone]   Fax:  [phone]  Physical Therapy Treatment  Patient Details  Name: Lance Crosby MRN: [id number] Date of Birth: 12-Sep-1945 Referring Provider (PT): Garald Balding MD   Encounter Date: 06/24/2020   PT End of Session - 06/24/20 0933    Visit Number 4    Number of Visits 20    Date for PT Re-Evaluation 07/22/20    Authorization Type 12 visits through 08/19/2020    Authorization - Visit Number 4    Authorization - Number of Visits 12    Progress Note Due on Visit 10    PT Start Time 0801    PT Stop Time 6568    PT Time Calculation (min) 43 min    Activity Tolerance Patient tolerated treatment well;No increased pain;Patient limited by fatigue    Behavior During Therapy [name] for tasks assessed/performed           Past Medical History:  Diagnosis Date  . Arthritis   . Claustrophobia   . Colon cancer ([place]) 2000   colon cancer  . Diabetes mellitus without complication ([place])   . GERD (gastroesophageal reflux disease)    if eats late   . Hypertension     Past Surgical History:  Procedure Laterality Date  . COLON SURGERY  02/1999  . COLONOSCOPY    . FINGER SURGERY     thumb surgery- September 2016  . KNEE SURGERY    . POLYPECTOMY    . ROTATOR CUFF REPAIR      There were no vitals filed for this visit.   Subjective Assessment - 06/24/20 0929    Subjective Lance Crosby reports he is sleeping 4-5 hours uninterrupted.  He reports good HEP compliance.    Pertinent History Cancer survivor, HTN, DM    Limitations Other (comment)   L UE function and sleeping   Patient Stated Goals Be able to sleep normally and use his L shoulder normally    Currently in Pain? No/denies    Pain Onset 1 to 4 weeks ago              [facility] PT Assessment - 06/24/20 0001      PROM   Left Shoulder Flexion 160 Degrees    Left Shoulder Internal Rotation 55 Degrees    Left  Shoulder External Rotation 65 Degrees                         [facility] Adult PT Treatment/Exercise - 06/24/20 0001      Exercises   Exercises Shoulder      Shoulder Exercises: Supine   Protraction AROM;Left;10 reps;Other (comment)   3 second hold   External Rotation AAROM;Left;10 reps;Other (comment)   10 seconds   Internal Rotation AAROM;Left;10 reps;Other (comment)   10 seconds     Shoulder Exercises: Seated   Other Seated Exercises Shoulder blade pinches 10X 5 seconds      Shoulder Exercises: Standing   Other Standing Exercises Codmans Pendulum 30X each F/B; CW; CCW      Manual Therapy   Manual Therapy Passive ROM    Manual therapy comments ER, IR                  PT Education - 06/24/20 0932    Education Details Reviewed and progressed HEP.  Discussed the importance of going slow and protecting the surgical repair during this pahse of  healing.    Person(s) Educated Patient    Methods Explanation;Demonstration;Tactile cues;Verbal cues;Handout    Comprehension Need further instruction;Returned demonstration;Verbal cues required;Verbalized understanding;Tactile cues required            PT Short Term Goals - 06/24/20 0933      PT SHORT TERM GOAL #1   Title Improve L shoulder PROM for flexion to 135; IR to 60; ER to 70 degrees    Baseline See objective    Time 4    Period Weeks    Status Partially Met    Target Date 07/08/20             PT Long Term Goals - 06/24/20 0933      PT LONG TERM GOAL #1   Title Improve FOTO score to 64    Baseline 37    Time 10    Period Weeks    Status On-going      PT LONG TERM GOAL #2   Title Improve L shoulder AROM to 90% of: Flexion 170; IR 60; ER 90; HA 40 degrees    Baseline See objective    Time 10    Period Weeks    Status On-going      PT LONG TERM GOAL #3   Title Lance Crosby will be able to reach overhead and behind his back without complaints of pain > 2/10 o functional difficulty at DC     Baseline Unable    Time 10    Period Weeks    Status On-going      PT LONG TERM GOAL #4   Title Lance Crosby will report L shoulder pain consistently 0-3/10 on the Numeric Pain Rating Scale    Baseline Can be 5+/10    Time 10    Period Weeks    Status On-going      PT LONG TERM GOAL #5   Title Lance Crosby will be independent with his HEP at DC    Time [place] - 06/24/20 0921    Clinical Impression Statement Lance Crosby is progressing with his post-RTC and biceps tenodesis physical therapy.  Given his < 5 week post-surgery status, strengthening is deferred and we will be moving more into AAROM and AROM.  Objectively, shoulder AROM is better.  Continue typical post-RTC repair with biceps tenodesis rehabilitation to meet long-term goals.    Personal Factors and Comorbidities Comorbidity 1    Comorbidities HTN, DM, cancer survivor    Examination-Activity Limitations Bathing;Dressing;Bed Mobility;Hygiene/Grooming;Sleep;Reach Overhead;Carry    Examination-Participation Restrictions Church;Interpersonal Relationship;Occupation;Community Activity    Stability/Clinical Decision Making Stable/Uncomplicated    Rehab Potential Good    PT Frequency 2x / week    PT Duration Other (comment)   10 weeks   PT Treatment/Interventions ADLs/Self Care Home Management;Moist Heat;Cryotherapy;Therapeutic activities;Therapeutic exercise;Neuromuscular re-education;Patient/family education;Manual techniques;Passive range of motion;Vasopneumatic Device    PT Next Visit Plan AAROM, Supine AROM, strength deferred    PT Home Exercise Plan Access Code: [email or web address]    Consulted and Agree with Plan of Care Patient           Patient will benefit from skilled therapeutic intervention in order to improve the following deficits and impairments:  Decreased activity tolerance,Decreased endurance,Decreased range of motion,Decreased strength,Increased edema,Impaired  flexibility,Impaired UE functional use,Postural dysfunction,Pain  Visit Diagnosis: Stiffness of left shoulder, not elsewhere classified  Muscle weakness (generalized)  Localized edema  Chronic left shoulder pain     Problem List Patient Active Problem List   Diagnosis Date Noted  . Cervicalgia 12/30/2019  . Bilateral primary osteoarthritis of knee 04/22/2019  . Pain in left shoulder 04/22/2019  . FLATULENCE-GAS-BLOATING 11/14/2009  . PERSONAL HX COLON CANCER 11/14/2009    Farley Ly PT, MPT 06/24/2020, 4:31 PM  [facility] [address] [place], [place], [address] Phone: [phone]   Fax:  [phone]  Name: LANIS STORLIE MRN: [id number] Date of Birth: 1945-05-16

## 2020-06-29 ENCOUNTER — Other Ambulatory Visit: Payer: Self-pay

## 2020-06-29 ENCOUNTER — Ambulatory Visit (INDEPENDENT_AMBULATORY_CARE_PROVIDER_SITE_OTHER): Payer: Medicare PPO | Admitting: Rehabilitative and Restorative Service Providers"

## 2020-06-29 ENCOUNTER — Encounter: Payer: Self-pay | Admitting: Rehabilitative and Restorative Service Providers"

## 2020-06-29 DIAGNOSIS — G8929 Other chronic pain: Secondary | ICD-10-CM

## 2020-06-29 DIAGNOSIS — M25612 Stiffness of left shoulder, not elsewhere classified: Secondary | ICD-10-CM

## 2020-06-29 DIAGNOSIS — M6281 Muscle weakness (generalized): Secondary | ICD-10-CM | POA: Diagnosis not present

## 2020-06-29 DIAGNOSIS — R600 Localized edema: Secondary | ICD-10-CM | POA: Diagnosis not present

## 2020-06-29 DIAGNOSIS — M25512 Pain in left shoulder: Secondary | ICD-10-CM

## 2020-06-29 NOTE — Therapy (Signed)
[facility] [address] [place], [place], [address] Phone: [phone]   Fax:  [phone]  Physical Therapy Treatment  Patient Details  Name: Lance Crosby MRN: [id number] Date of Birth: [birth year] Referring Provider (PT): Garald Balding MD   Encounter Date: 06/29/2020   PT End of Session - 06/29/20 1439    Visit Number 5    Number of Visits 20    Date for PT Re-Evaluation 07/22/20    Authorization Type 12 visits through 08/19/2020    Authorization - Visit Number 5    Authorization - Number of Visits 12    Progress Note Due on Visit 10    PT Start Time 4628    PT Stop Time 1427    PT Time Calculation (min) 39 min    Activity Tolerance Patient tolerated treatment well;No increased pain    Behavior During Therapy [facility] for tasks assessed/performed           Past Medical History:  Diagnosis Date  . Arthritis   . Claustrophobia   . Colon cancer ([place]) 2000   colon cancer  . Diabetes mellitus without complication ([place])   . GERD (gastroesophageal reflux disease)    if eats late   . Hypertension     Past Surgical History:  Procedure Laterality Date  . COLON SURGERY  02/1999  . COLONOSCOPY    . FINGER SURGERY     thumb surgery- September 2016  . KNEE SURGERY    . POLYPECTOMY    . ROTATOR CUFF REPAIR      There were no vitals filed for this visit.   Subjective Assessment - 06/29/20 1424    Subjective Lance Crosby reports 5+ hours of uninterrupted sleep.  Pain has not been bad.    Pertinent History Cancer survivor, HTN, DM    Limitations Other (comment)   L UE function and sleeping   Patient Stated Goals Be able to sleep normally and use his L shoulder normally    Currently in Pain? Yes    Pain Score 2     Pain Location Shoulder    Pain Orientation Left    Pain Descriptors / Indicators Aching;Sore    Pain Type Chronic pain;Surgical pain    Pain Onset More than a month ago    Pain Frequency Intermittent    Aggravating  Factors  Sore with some pain at night    Pain Relieving Factors Rest    Effect of Pain on Daily Activities Affects all L UE use and sleep after 5-6 hours    Multiple Pain Sites No              [facility] PT Assessment - 06/29/20 0001      PROM   Left Shoulder Flexion 160 Degrees    Left Shoulder Internal Rotation 60 Degrees    Left Shoulder External Rotation 65 Degrees                         [facility] Adult PT Treatment/Exercise - 06/29/20 0001      Exercises   Exercises Shoulder      Shoulder Exercises: Supine   Protraction AROM;Left;20 reps;Other (comment)    Protraction Limitations 3 seconds, supine arm raises palm in    External Rotation AAROM;Left;10 reps;Other (comment)   10 seconds   Internal Rotation AAROM;Left;10 reps;Other (comment)   10 seconds     Shoulder Exercises: Seated   Other Seated Exercises Shoulder blade pinches 10X  5 seconds      Shoulder Exercises: Standing   Other Standing Exercises Codmans Pendulum 30X each F/B; CW; CCW      Manual Therapy   Manual Therapy Passive ROM    Manual therapy comments ER, IR                  PT Education - 06/29/20 1437    Education Details Reviewed HEP particularly supine stretches.  Reminded Lance Crosby to be conservative with L UE use.    Person(s) Educated Patient    Methods Explanation;Demonstration;Tactile cues;Verbal cues    Comprehension Returned demonstration;Need further instruction;Verbalized understanding;Verbal cues required            PT Short Term Goals - 06/29/20 1438      PT SHORT TERM GOAL #1   Title Improve L shoulder PROM for flexion to 135; IR to 60; ER to 70 degrees    Baseline Only ER not met as of 06/29/2020    Time 4    Period Weeks    Status Partially Met    Target Date 07/08/20             PT Long Term Goals - 06/29/20 1438      PT LONG TERM GOAL #1   Title Improve FOTO score to 64    Baseline 37    Time 10    Period Weeks    Status On-going      PT LONG  TERM GOAL #2   Title Improve L shoulder AROM to 90% of: Flexion 170; IR 60; ER 90; HA 40 degrees    Baseline See objective    Time 10    Period Weeks    Status On-going      PT LONG TERM GOAL #3   Title Lance Crosby will be able to reach overhead and behind his back without complaints of pain > 2/10 o functional difficulty at DC    Baseline Unable    Time 10    Period Weeks    Status On-going      PT LONG TERM GOAL #4   Title Lance Crosby will report L shoulder pain consistently 0-3/10 on the Numeric Pain Rating Scale    Baseline Can be 5+/10    Time 10    Period Weeks    Status On-going      PT LONG TERM GOAL #5   Title Lance Crosby will be independent with his HEP at DC    Time 10    Period Weeks    Status On-going                 Plan - 06/29/20 1440    Clinical Impression Statement Lance Crosby is on target to meet post-surgical goals.  I anticipate making progressions with his scapular strength next week with gradual progression into anti-gravity RTC strengthening.  Resisted biceps and rotator cuff strength will be progressed week 10 post-surgery.    Personal Factors and Comorbidities Comorbidity 1    Comorbidities HTN, DM, cancer survivor    Examination-Activity Limitations Bathing;Dressing;Bed Mobility;Hygiene/Grooming;Sleep;Reach Overhead;Carry    Examination-Participation Restrictions Church;Interpersonal Relationship;Occupation;Community Activity    Stability/Clinical Decision Making Stable/Uncomplicated    Rehab Potential Good    PT Frequency 2x / week    PT Duration Other (comment)   10 weeks   PT Treatment/Interventions ADLs/Self Care Home Management;Moist Heat;Cryotherapy;Therapeutic activities;Therapeutic exercise;Neuromuscular re-education;Patient/family education;Manual techniques;Passive range of motion;Vasopneumatic Device    PT Next Visit Plan AAROM, Supine AROM, strength deferred  PT Home Exercise Plan Access Code: [email or web address]    Consulted and Agree with Plan of Care  Patient           Patient will benefit from skilled therapeutic intervention in order to improve the following deficits and impairments:  Decreased activity tolerance,Decreased endurance,Decreased range of motion,Decreased strength,Increased edema,Impaired flexibility,Impaired UE functional use,Postural dysfunction,Pain  Visit Diagnosis: Stiffness of left shoulder, not elsewhere classified  Muscle weakness (generalized)  Chronic left shoulder pain  Localized edema     Problem List Patient Active Problem List   Diagnosis Date Noted  . Cervicalgia 12/30/2019  . Bilateral primary osteoarthritis of knee 04/22/2019  . Pain in left shoulder 04/22/2019  . FLATULENCE-GAS-BLOATING 11/14/2009  . PERSONAL HX COLON CANCER 11/14/2009    Farley Ly PT, MPT 06/29/2020, 2:42 PM  [facility] [address] [place], [place], [address] Phone: [phone]   Fax:  [phone]  Name: Lance Crosby MRN: [id number] Date of Birth: May 29, 1945

## 2020-07-01 ENCOUNTER — Other Ambulatory Visit: Payer: Self-pay

## 2020-07-01 ENCOUNTER — Encounter: Payer: Self-pay | Admitting: Rehabilitative and Restorative Service Providers"

## 2020-07-01 ENCOUNTER — Ambulatory Visit (INDEPENDENT_AMBULATORY_CARE_PROVIDER_SITE_OTHER): Payer: Medicare PPO | Admitting: Rehabilitative and Restorative Service Providers"

## 2020-07-01 DIAGNOSIS — M6281 Muscle weakness (generalized): Secondary | ICD-10-CM

## 2020-07-01 DIAGNOSIS — M25512 Pain in left shoulder: Secondary | ICD-10-CM

## 2020-07-01 DIAGNOSIS — G8929 Other chronic pain: Secondary | ICD-10-CM

## 2020-07-01 DIAGNOSIS — R600 Localized edema: Secondary | ICD-10-CM | POA: Diagnosis not present

## 2020-07-01 DIAGNOSIS — M25612 Stiffness of left shoulder, not elsewhere classified: Secondary | ICD-10-CM | POA: Diagnosis not present

## 2020-07-01 NOTE — Patient Instructions (Signed)
Access Code: [email or web address] URL: [email or web address] Date: 07/01/2020 Prepared by: Vista Mink  Exercises Horizontal Shoulder Pendulum with Table Support - 2-3 x daily - 7 x weekly - 1 sets - 20-30 reps Supine Scapular Protraction in Flexion with Dumbbells - 2 x daily - 7 x weekly - 1 sets - 20 reps - 3 second hold Seated Scapular Retraction - 5 x daily - 7 x weekly - 1 sets - 5 reps - 5 seconds hold Supine Shoulder Internal Rotation Stretch - 2 x daily - 7 x weekly - 1 sets - 10-20 reps - 10 secondsinutes hold Supine Shoulder External Rotation Stretch - 2 x daily - 7 x weekly - 1 sets - 10-20 reps - 10 seconds hold Sidelying Shoulder External Rotation Dumbbell - 1 x daily - 7 x weekly - 2 sets - 10 reps - 3 seconds hold

## 2020-07-01 NOTE — Therapy (Signed)
[facility] Physical Therapy [address] [place], [place], [address] Phone: [phone]   Fax:  [phone]  Physical Therapy Treatment/Reassessment  Patient Details  Name: Lance Crosby MRN: [id number] Date of Birth: 12-11-1945 Referring Provider (PT): Garald Balding MD   Encounter Date: 07/01/2020   PT End of Session - 07/01/20 1023    Visit Number 6    Number of Visits 20    Date for PT Re-Evaluation 07/22/20    Authorization Type 12 visits through 08/19/2020    Authorization - Visit Number 6    Authorization - Number of Visits 12    Progress Note Due on Visit 12    PT Start Time 0932    PT Stop Time 1016    PT Time Calculation (min) 44 min    Activity Tolerance Patient tolerated treatment well    Behavior During Therapy [name]           Past Medical History:  Diagnosis Date  . Arthritis   . Claustrophobia   . Colon cancer ([place]) 2000   colon cancer  . Diabetes mellitus without complication ([place])   . GERD (gastroesophageal reflux disease)    if eats late   . Hypertension     Past Surgical History:  Procedure Laterality Date  . COLON SURGERY  02/1999  . COLONOSCOPY    . FINGER SURGERY     thumb surgery- September 2016  . KNEE SURGERY    . POLYPECTOMY    . ROTATOR CUFF REPAIR      There were no vitals filed for this visit.   Subjective Assessment - 07/01/20 0941    Subjective Lance Crosby reports continued progress with his sleep.  Pain has not been bad.    Pertinent History Cancer survivor, HTN, DM    Limitations Other (comment)   L UE function and sleeping   Patient Stated Goals Be able to sleep normally and use his L shoulder normally    Currently in Pain? Yes    Pain Score 3     Pain Location Shoulder    Pain Orientation Left    Pain Descriptors / Indicators Aching;Sore    Pain Type Chronic pain;Surgical pain    Pain Onset More than a month ago    Pain Frequency Intermittent    Aggravating Factors   Overuse    Pain Relieving Factors Rest    Effect of Pain on Daily Activities Affects all L UE use    Multiple Pain Sites No              [facility] PT Assessment - 07/01/20 0001      ROM / Strength   AROM / PROM / Strength PROM      PROM   Overall PROM  Deficits    PROM Assessment Site Shoulder    Right/Left Shoulder Left    Left Shoulder Flexion 160 Degrees    Left Shoulder Internal Rotation 60 Degrees    Left Shoulder External Rotation 65 Degrees                         [facility] Adult PT Treatment/Exercise - 07/01/20 0001      Exercises   Exercises Shoulder      Shoulder Exercises: Supine   Protraction AROM;Left;20 reps;Other (comment)    Protraction Weight (lbs) 1#    Protraction Limitations 3 seconds, supine arm raises palm in    External Rotation AAROM;Left;10 reps;Other (comment)  10 seconds   Internal Rotation AAROM;Left;10 reps;Other (comment)   10 seconds   Flexion --   Attempted-too early     Shoulder Exercises: Seated   Other Seated Exercises Shoulder blade pinches 10X 5 seconds      Shoulder Exercises: Sidelying   External Rotation Strengthening;Left;10 reps;Other (comment)   2 sets anti-gravity no weight     Shoulder Exercises: Standing   Other Standing Exercises Codmans Pendulum 30X each F/B; CW; CCW      Manual Therapy   Manual Therapy Passive ROM    Manual therapy comments ER, IR                  PT Education - 07/01/20 0943    Education Details Reviewed and added to HEP.  Reviewed exam findings.    Person(s) Educated Patient    Methods Explanation;Demonstration;Tactile cues;Verbal cues;Handout    Comprehension Verbal cues required;Returned demonstration;Need further instruction;Tactile cues required;Verbalized understanding            PT Short Term Goals - 07/01/20 1006      PT SHORT TERM GOAL #1   Title Improve L shoulder PROM for flexion to 135; IR to 60; ER to 70 degrees    Baseline Only ER not met as of 07/01/2020     Time 4    Period Weeks    Status Partially Met    Target Date 07/08/20             PT Long Term Goals - 07/01/20 1007      PT LONG TERM GOAL #1   Title Improve FOTO score to 64    Baseline 37 at evaluation, 53 on 07/01/2020    Time 6    Period Weeks    Status On-going    Target Date 08/12/20      PT LONG TERM GOAL #2   Title Improve L shoulder AROM to 90% of: Flexion 170; IR 60; ER 90; HA 40 degrees    Baseline Flexion, IR and ER are at 89% with horizontal adduction deferred for 2 more weeks    Time 10    Period Weeks    Status On-going    Target Date 08/12/20      PT LONG TERM GOAL #3   Title Lance Crosby will be able to reach overhead and behind his back without complaints of pain > 2/10 o functional difficulty at [place], shrug with flexion, looks good with behind the back    Time 10    Period Weeks    Status Partially Met      PT LONG TERM GOAL #4   Title Lance Crosby will report L shoulder pain consistently 0-3/10 on the Numeric Pain Rating Scale    Baseline Can briefly be 1+/30 with certain positions    Time 10    Period Weeks    Status On-going      PT LONG TERM GOAL #5   Title Lance Crosby will be independent with his HEP at DC    Time [place] - 07/01/20 Bernice is making good progress with his capsular flexibility.  PROM is 89% of 12 week goal for flexion, IR and ER AROM.  Horizontal adduction is deferred to not stress the supraspinatus.  AROM, scapular and anti-gravity rotator  cuff strengthening without resistance will be the emphasis moving forward with increased resistance week 10-12 post-surgery.  Lance Crosby is on track to meet long-term goals.    Personal Factors and Comorbidities Comorbidity 1    Comorbidities HTN, DM, cancer survivor    Examination-Activity Limitations Bathing;Dressing;Bed Mobility;Hygiene/Grooming;Sleep;Reach Overhead;Carry     Examination-Participation Restrictions Church;Interpersonal Relationship;Occupation;Community Activity    Stability/Clinical Decision Making Stable/Uncomplicated    Rehab Potential Good    PT Frequency 2x / week    PT Duration 6 weeks   10 weeks   PT Treatment/Interventions ADLs/Self Care Home Management;Moist Heat;Cryotherapy;Therapeutic activities;Therapeutic exercise;Neuromuscular re-education;Patient/family education;Manual techniques;Passive range of motion;Vasopneumatic Device    PT Next Visit Plan AROM, scapular and anti-gravity (no weight) RTC strength    PT Home Exercise Plan Access Code: [email or web address]    Consulted and Agree with Plan of Care Patient           Patient will benefit from skilled therapeutic intervention in order to improve the following deficits and impairments:  Decreased activity tolerance,Decreased endurance,Decreased range of motion,Decreased strength,Increased edema,Impaired flexibility,Impaired UE functional use,Postural dysfunction,Pain  Visit Diagnosis: Muscle weakness (generalized)  Stiffness of left shoulder, not elsewhere classified  Chronic left shoulder pain  Localized edema     Problem List Patient Active Problem List   Diagnosis Date Noted  . Cervicalgia 12/30/2019  . Bilateral primary osteoarthritis of knee 04/22/2019  . Pain in left shoulder 04/22/2019  . FLATULENCE-GAS-BLOATING 11/14/2009  . PERSONAL HX COLON CANCER 11/14/2009    Farley Ly  PT, MPT 07/01/2020, 10:28 AM  [facility] Physical Therapy [address] [place], [place], [address] Phone: [phone]   Fax:  [phone]  Name: NICHAEL EHLY MRN: [id number] Date of Birth: [birth year]

## 2020-07-05 ENCOUNTER — Ambulatory Visit (INDEPENDENT_AMBULATORY_CARE_PROVIDER_SITE_OTHER): Payer: Medicare PPO | Admitting: Rehabilitative and Restorative Service Providers"

## 2020-07-05 ENCOUNTER — Other Ambulatory Visit: Payer: Self-pay

## 2020-07-05 ENCOUNTER — Encounter: Payer: Self-pay | Admitting: Rehabilitative and Restorative Service Providers"

## 2020-07-05 DIAGNOSIS — M6281 Muscle weakness (generalized): Secondary | ICD-10-CM

## 2020-07-05 DIAGNOSIS — M25512 Pain in left shoulder: Secondary | ICD-10-CM | POA: Diagnosis not present

## 2020-07-05 DIAGNOSIS — R600 Localized edema: Secondary | ICD-10-CM | POA: Diagnosis not present

## 2020-07-05 DIAGNOSIS — M25612 Stiffness of left shoulder, not elsewhere classified: Secondary | ICD-10-CM | POA: Diagnosis not present

## 2020-07-05 DIAGNOSIS — G8929 Other chronic pain: Secondary | ICD-10-CM

## 2020-07-05 NOTE — Patient Instructions (Signed)
Access Code: [email or web address] URL: [email or web address] Date: 07/05/2020 Prepared by: Scot Jun  Exercises Horizontal Shoulder Pendulum with Table Support - 2-3 x daily - 7 x weekly - 1 sets - 20-30 reps Supine Scapular Protraction in Flexion with Dumbbells - 2 x daily - 7 x weekly - 1 sets - 20 reps - 3 second hold Seated Scapular Retraction - 5 x daily - 7 x weekly - 1 sets - 5 reps - 5 seconds hold Supine Shoulder Internal Rotation Stretch - 2 x daily - 7 x weekly - 1 sets - 10-20 reps - 10 secondsinutes hold Supine Shoulder External Rotation Stretch - 2 x daily - 7 x weekly - 1 sets - 10-20 reps - 10 seconds hold Sidelying Shoulder External Rotation Dumbbell - 2 x daily - 7 x weekly - 2 sets - 10 reps - 3 seconds hold Supine Shoulder Flexion Extension Full Range AROM - 2 x daily - 7 x weekly - 1 sets Sidelying Shoulder Abduction Palm Forward - 2 x daily - 7 x weekly - 3 sets - 10 reps

## 2020-07-05 NOTE — Therapy (Signed)
[facility] Physical Therapy [address] [place], [place], [address] Phone: [phone]   Fax:  [phone]  Physical Therapy Treatment  Patient Details  Name: Lance Crosby MRN: [id number] Date of Birth: [birth year] Referring Provider (PT): Garald Balding MD   Encounter Date: 07/05/2020   PT End of Session - 07/05/20 0958    Visit Number 7    Number of Visits 20    Date for PT Re-Evaluation 07/22/20    Authorization Type 12 visits through 08/19/2020    Authorization - Visit Number 7    Authorization - Number of Visits 12    Progress Note Due on Visit 12    PT Start Time 0928    PT Stop Time 1006    PT Time Calculation (min) 38 min    Activity Tolerance Patient tolerated treatment well    Behavior During Therapy [facility] for tasks assessed/performed           Past Medical History:  Diagnosis Date  . Arthritis   . Claustrophobia   . Colon cancer ([place]) 2000   colon cancer  . Diabetes mellitus without complication ([place])   . GERD (gastroesophageal reflux disease)    if eats late   . Hypertension     Past Surgical History:  Procedure Laterality Date  . COLON SURGERY  02/1999  . COLONOSCOPY    . FINGER SURGERY     thumb surgery- September 2016  . KNEE SURGERY    . POLYPECTOMY    . ROTATOR CUFF REPAIR      There were no vitals filed for this visit.   Subjective Assessment - 07/05/20 0930    Subjective Pt. stated arm seems to be getting better.  no pain today.  Pt. indicated reaching is troublesome still.    Pertinent History Cancer survivor, HTN, DM    Limitations Other (comment)   L UE function and sleeping   Patient Stated Goals Be able to sleep normally and use his L shoulder normally    Pain Onset More than a month ago              [facility] PT Assessment - 07/05/20 0001      Assessment   Medical Diagnosis s/p arthroscopy of Lt shoulder      Precautions   Precaution Comments no longer in prom/AAROM restriction (ended 06/30/2020 per  MD referral)                         [facility] Adult PT Treatment/Exercise - 07/05/20 0001      Shoulder Exercises: Supine   Protraction Left;Strengthening;10 reps    Protraction Weight (lbs) 2    Protraction Limitations 5 sec hold    External Rotation Other (comment)   Reviewed for HEP continuation   Internal Rotation Other (comment)   Reviewed for HEP continuation   Flexion Left;AROM;Strengthening   full movement x 15 0 lbs , 1 lb to fatigue 4 mins (67 reps)     Shoulder Exercises: Seated   Other Seated Exercises Shoulder blade pinches 10X 5 seconds      Shoulder Exercises: ROM/Strengthening   UBE (Upper Arm Bike) Lvl 2.5 3 mins fwd/back each way                  PT Education - 07/05/20 1003    Education Details Progression of HEP    Person(s) Educated Patient    Methods Explanation;Demonstration;Verbal cues;Handout    Comprehension Returned  demonstration;Verbalized understanding            PT Short Term Goals - 07/05/20 0958      PT SHORT TERM GOAL #1   Title Improve Lt shoulder PROM for flexion to 135; IR to 60; ER to 70 degrees    Baseline Only ER not met as of 07/01/2020    Time 4    Period Weeks    Status Partially Met    Target Date 07/08/20             PT Long Term Goals - 07/01/20 1007      PT LONG TERM GOAL #1   Title Improve FOTO score to 64    Baseline 37 at evaluation, 53 on 07/01/2020    Time 6    Period Weeks    Status On-going    Target Date 08/12/20      PT LONG TERM GOAL #2   Title Improve L shoulder AROM to 90% of: Flexion 170; IR 60; ER 90; HA 40 degrees    Baseline Flexion, IR and ER are at 89% with horizontal adduction deferred for 2 more weeks    Time 10    Period Weeks    Status On-going    Target Date 08/12/20      PT LONG TERM GOAL #3   Title Mohsin will be able to reach overhead and behind his back without complaints of pain > 2/10 o functional difficulty at [place], shrug with flexion, looks  good with behind the back    Time 10    Period Weeks    Status Partially Met      PT LONG TERM GOAL #4   Title Krystian will report L shoulder pain consistently 0-3/10 on the Numeric Pain Rating Scale    Baseline Can briefly be 7+/68 with certain positions    Time 10    Period Weeks    Status On-going      PT LONG TERM GOAL #5   Title Rjay will be independent with his HEP at DC    Time [place] - 07/05/20 1157    Clinical Impression Statement no longer in prom/AAROM restriction (ended 06/30/2020 per MD referral).  Good transitioning today into AROM in supine/sidelying gravity reduced positioning c no weight/1 lb weight.  Due to good performance and response, added to HEP (supine flexion, sidelying abd).  Pt. appropriate for continued progressive resistance gains in gravity reduced AROM to avoid maladaptive shrug movements against gravity at this time.    Personal Factors and Comorbidities Comorbidity 1    Comorbidities HTN, DM, cancer survivor    Examination-Activity Limitations Bathing;Dressing;Bed Mobility;Hygiene/Grooming;Sleep;Reach Overhead;Carry    Examination-Participation Restrictions Church;Interpersonal Relationship;Occupation;Community Activity    Stability/Clinical Decision Making Stable/Uncomplicated    Rehab Potential Good    PT Frequency 2x / week    PT Duration 6 weeks    PT Treatment/Interventions ADLs/Self Care Home Management;Moist Heat;Cryotherapy;Therapeutic activities;Therapeutic exercise;Neuromuscular re-education;Patient/family education;Manual techniques;Passive range of motion;Vasopneumatic Device    PT Next Visit Plan Return to MD for follow up.  Continue progression of AROM, light resistance (1-2 lbs) progression based off performance/symptom response in gravity reduced positioning to avoid shrug    PT Home Exercise Plan Access Code: [email or web address]    Consulted and Agree with Plan of Care Patient  Patient will benefit from skilled therapeutic intervention in order to improve the following deficits and impairments:  Decreased activity tolerance,Decreased endurance,Decreased range of motion,Decreased strength,Increased edema,Impaired flexibility,Impaired UE functional use,Postural dysfunction,Pain  Visit Diagnosis: Chronic left shoulder pain  Stiffness of left shoulder, not elsewhere classified  Muscle weakness (generalized)  Localized edema     Problem List Patient Active Problem List   Diagnosis Date Noted  . Cervicalgia 12/30/2019  . Bilateral primary osteoarthritis of knee 04/22/2019  . Pain in left shoulder 04/22/2019  . FLATULENCE-GAS-BLOATING 11/14/2009  . PERSONAL HX COLON CANCER 11/14/2009    Scot Jun, PT, DPT, OCS, ATC 07/05/20  10:05 AM    [facility] Physical Therapy [address] [place], [place], [address] Phone: [phone]   Fax:  [phone]  Name: Lance Crosby MRN: [id number] Date of Birth: 1945-08-09

## 2020-07-07 ENCOUNTER — Ambulatory Visit (INDEPENDENT_AMBULATORY_CARE_PROVIDER_SITE_OTHER): Payer: Medicare PPO | Admitting: Orthopedic Surgery

## 2020-07-07 DIAGNOSIS — Z9889 Other specified postprocedural states: Secondary | ICD-10-CM

## 2020-07-08 ENCOUNTER — Ambulatory Visit (INDEPENDENT_AMBULATORY_CARE_PROVIDER_SITE_OTHER): Payer: Medicare PPO | Admitting: Rehabilitative and Restorative Service Providers"

## 2020-07-08 ENCOUNTER — Encounter: Payer: Self-pay | Admitting: Rehabilitative and Restorative Service Providers"

## 2020-07-08 ENCOUNTER — Other Ambulatory Visit: Payer: Self-pay

## 2020-07-08 DIAGNOSIS — M25612 Stiffness of left shoulder, not elsewhere classified: Secondary | ICD-10-CM

## 2020-07-08 DIAGNOSIS — R600 Localized edema: Secondary | ICD-10-CM

## 2020-07-08 DIAGNOSIS — M6281 Muscle weakness (generalized): Secondary | ICD-10-CM

## 2020-07-08 DIAGNOSIS — M25512 Pain in left shoulder: Secondary | ICD-10-CM | POA: Diagnosis not present
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
## 2020-07-09 ENCOUNTER — Encounter: Payer: Self-pay | Admitting: Orthopedic Surgery

## 2020-07-09 NOTE — Progress Notes (Signed)
   Post-Op Visit Note   Patient: Lance Crosby           Date of Birth: 1945/11/16           MRN: [id number] Visit Date: 07/07/2020 PCP: Kristie Cowman, MD   Assessment & Plan:  Chief Complaint:  Chief Complaint  Patient presents with  . Left Shoulder - Routine Post Op   Visit Diagnoses:  1. S/P arthroscopy of left shoulder     Plan: Patient is a 75 year old male who presents following left shoulder arthroscopy with Dr. Durward Fortes.  He is about 6 weeks out from procedure.  He reports he is doing great with occasional soreness and stiffness of the shoulder that he notices.  He is currently in physical therapy and has 3 sessions left.  He takes pain medication on occasion but does not take any more than 1 dose per day.  He feels he has not hit a wall or plateaued yet in physical therapy.  He has not started strengthening yet in therapy.  On exam his incisions have healed well with no evidence of infection or dehiscence.  He has 30 degrees external rotation, 85 degrees abduction, 135 degrees forward flexion passively.  He is able to actively forward flex his arm to 90 degrees and actively abductor 60 degrees.  Plan to continue physical therapy upstairs and begin to start strengthening.  Follow-up in 3 weeks for clinical recheck.  He was instructed to follow-up with Dr. Durward Fortes as he has not seen him since surgery.  Follow-Up Instructions: No follow-ups on file.   Orders:  Orders Placed This Encounter  Procedures  . Ambulatory referral to Physical Therapy   No orders of the defined types were placed in this encounter.   Imaging: No results found.  PMFS History: Patient Active Problem List   Diagnosis Date Noted  . Cervicalgia 12/30/2019  . Bilateral primary osteoarthritis of knee 04/22/2019  . Pain in left shoulder 04/22/2019  . FLATULENCE-GAS-BLOATING 11/14/2009  . PERSONAL HX COLON CANCER 11/14/2009   Past Medical History:  Diagnosis Date  . Arthritis   . Claustrophobia    . Colon cancer ([place]) 2000   colon cancer  . Diabetes mellitus without complication ([place])   . GERD (gastroesophageal reflux disease)    if eats late   . Hypertension     Family History  Problem Relation Age of Onset  . Colon cancer Neg Hx   . Esophageal cancer Neg Hx   . Rectal cancer Neg Hx   . Stomach cancer Neg Hx   . Colon polyps Neg Hx     Past Surgical History:  Procedure Laterality Date  . COLON SURGERY  02/1999  . COLONOSCOPY    . FINGER SURGERY     thumb surgery- September 2016  . KNEE SURGERY    . POLYPECTOMY    . ROTATOR CUFF REPAIR     Social History   Occupational History  . Not on file  Tobacco Use  . Smoking status: Former Smoker    Packs/day: 1.00    Years: 5.00    Pack years: 5.00    Types: Cigarettes    Quit date: 06/07/1973    Years since quitting: 47.1  . Smokeless tobacco: Never Used  Vaping Use  . Vaping Use: Never used  Substance and Sexual Activity  . Alcohol use: No  . Drug use: No  . Sexual activity: Not on file

## 2020-07-13 ENCOUNTER — Other Ambulatory Visit: Payer: Self-pay

## 2020-07-13 ENCOUNTER — Ambulatory Visit (INDEPENDENT_AMBULATORY_CARE_PROVIDER_SITE_OTHER): Payer: Self-pay

## 2020-07-13 ENCOUNTER — Ambulatory Visit (INDEPENDENT_AMBULATORY_CARE_PROVIDER_SITE_OTHER): Payer: Self-pay | Admitting: Orthopaedic Surgery

## 2020-07-13 ENCOUNTER — Encounter: Payer: Self-pay | Admitting: Orthopaedic Surgery

## 2020-07-13 ENCOUNTER — Encounter: Payer: Medicare PPO | Admitting: Rehabilitative and Restorative Service Providers"

## 2020-07-13 VITALS — Ht 67.0 in | Wt 189.0 lb

## 2020-07-13 DIAGNOSIS — M542 Cervicalgia: Secondary | ICD-10-CM

## 2020-07-13 DIAGNOSIS — S46012D Strain of muscle(s) and tendon(s) of the rotator cuff of left shoulder, subsequent encounter: Secondary | ICD-10-CM

## 2020-07-13 DIAGNOSIS — M75122 Complete rotator cuff tear or rupture of left shoulder, not specified as traumatic: Secondary | ICD-10-CM | POA: Insufficient documentation
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
## 2020-07-14 ENCOUNTER — Ambulatory Visit (INDEPENDENT_AMBULATORY_CARE_PROVIDER_SITE_OTHER): Payer: Medicare PPO | Admitting: Rehabilitative and Restorative Service Providers"

## 2020-07-14 ENCOUNTER — Encounter: Payer: Self-pay | Admitting: Rehabilitative and Restorative Service Providers"

## 2020-07-14 DIAGNOSIS — M25512 Pain in left shoulder: Secondary | ICD-10-CM

## 2020-07-14 DIAGNOSIS — G8929 Other chronic pain: Secondary | ICD-10-CM

## 2020-07-14 DIAGNOSIS — M6281 Muscle weakness (generalized): Secondary | ICD-10-CM | POA: Diagnosis not present

## 2020-07-14 DIAGNOSIS — M25612 Stiffness of left shoulder, not elsewhere classified: Secondary | ICD-10-CM | POA: Diagnosis not present

## 2020-07-14 DIAGNOSIS — R600 Localized edema: Secondary | ICD-10-CM | POA: Diagnosis not present
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
## 2020-07-14 NOTE — Patient Instructions (Signed)
Access Code: [email or web address] URL: [email or web address] Date: 07/14/2020 Prepared by: Vista Mink  Exercises Horizontal Shoulder Pendulum with Table Support - 2-3 x daily - 7 x weekly - 1 sets - 20-30 reps Supine Scapular Protraction in Flexion with Dumbbells - 2 x daily - 7 x weekly - 1 sets - 20 reps - 3 second hold Seated Scapular Retraction - 5 x daily - 7 x weekly - 1 sets - 5 reps - 5 seconds hold Supine Shoulder Internal Rotation Stretch - 2 x daily - 7 x weekly - 1 sets - 10-20 reps - 10 secondsinutes hold Supine Shoulder External Rotation Stretch - 2 x daily - 7 x weekly - 1 sets - 10-20 reps - 10 seconds hold Sidelying Shoulder External Rotation Dumbbell - 1 x daily - 7 x weekly - 2 sets - 10 reps - 3 seconds hold Supine Shoulder Flexion Extension Full Range AROM - 2 x daily - 7 x weekly - 1 sets Sidelying Shoulder Abduction Palm Forward - 2 x daily - 7 x weekly - 3 sets - 10 reps Standing Isometric Cervical Extension with Manual Resistance - 5 x daily - 7 x weekly - 1 sets - 5 reps - 5 seconds hold

## 2020-07-15 ENCOUNTER — Other Ambulatory Visit: Payer: Self-pay

## 2020-07-15 ENCOUNTER — Ambulatory Visit (INDEPENDENT_AMBULATORY_CARE_PROVIDER_SITE_OTHER): Payer: Medicare PPO | Admitting: Rehabilitative and Restorative Service Providers"

## 2020-07-15 ENCOUNTER — Encounter: Payer: Self-pay | Admitting: Rehabilitative and Restorative Service Providers"

## 2020-07-15 DIAGNOSIS — M25612 Stiffness of left shoulder, not elsewhere classified: Secondary | ICD-10-CM

## 2020-07-15 DIAGNOSIS — M25512 Pain in left shoulder: Secondary | ICD-10-CM

## 2020-07-15 DIAGNOSIS — M6281 Muscle weakness (generalized): Secondary | ICD-10-CM

## 2020-07-15 DIAGNOSIS — G8929 Other chronic pain: Secondary | ICD-10-CM

## 2020-07-15 DIAGNOSIS — M542 Cervicalgia: Secondary | ICD-10-CM

## 2020-07-15 DIAGNOSIS — R600 Localized edema: Secondary | ICD-10-CM
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
## 2020-07-28 ENCOUNTER — Other Ambulatory Visit: Payer: Self-pay

## 2020-07-28 ENCOUNTER — Other Ambulatory Visit: Payer: Self-pay | Admitting: Orthopaedic Surgery

## 2020-07-28 ENCOUNTER — Ambulatory Visit (INDEPENDENT_AMBULATORY_CARE_PROVIDER_SITE_OTHER): Payer: Medicare PPO | Admitting: Orthopaedic Surgery

## 2020-07-28 ENCOUNTER — Encounter: Payer: Self-pay | Admitting: Orthopaedic Surgery

## 2020-07-28 DIAGNOSIS — M542 Cervicalgia: Secondary | ICD-10-CM | POA: Diagnosis not present

## 2020-07-28 DIAGNOSIS — S46012D Strain of muscle(s) and tendon(s) of the rotator cuff of left shoulder, subsequent encounter: Secondary | ICD-10-CM | POA: Diagnosis not present

## 2020-07-28 MED ORDER — DIAZEPAM 5 MG PO TABS: 5.0000 mg | ORAL_TABLET | ORAL | 0 refills | Status: AC
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
## 2020-08-12 ENCOUNTER — Ambulatory Visit: Payer: Self-pay | Admitting: Podiatry

## 2021-04-21 ENCOUNTER — Emergency Department (HOSPITAL_BASED_OUTPATIENT_CLINIC_OR_DEPARTMENT_OTHER)
Admission: EM | Admit: 2021-04-21 | Discharge: 2021-04-21 | Disposition: A | Discharge status: Home / Self Care | Payer: Medicare PPO | Attending: Emergency Medicine | Admitting: Emergency Medicine

## 2021-04-21 ENCOUNTER — Other Ambulatory Visit: Payer: Self-pay

## 2021-04-21 ENCOUNTER — Encounter (HOSPITAL_BASED_OUTPATIENT_CLINIC_OR_DEPARTMENT_OTHER): Payer: Self-pay

## 2021-04-21 DIAGNOSIS — Z79899 Other long term (current) drug therapy: Secondary | ICD-10-CM | POA: Diagnosis not present

## 2021-04-21 DIAGNOSIS — H6122 Impacted cerumen, left ear: Secondary | ICD-10-CM | POA: Diagnosis present

## 2021-04-21 NOTE — ED Triage Notes (Signed)
Pt presents to ED with wax build up to his Left ear x1 month. Unable to get it out at home. Pt also requesting to have the Left ear looked at.
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
## 2021-05-20 ENCOUNTER — Encounter (HOSPITAL_BASED_OUTPATIENT_CLINIC_OR_DEPARTMENT_OTHER): Payer: Self-pay | Admitting: Emergency Medicine

## 2021-05-20 ENCOUNTER — Emergency Department (HOSPITAL_BASED_OUTPATIENT_CLINIC_OR_DEPARTMENT_OTHER)
Admission: EM | Admit: 2021-05-20 | Discharge: 2021-05-20 | Disposition: A | Discharge status: Home / Self Care | Payer: Medicare PPO | Attending: Emergency Medicine | Admitting: Emergency Medicine

## 2021-05-20 ENCOUNTER — Other Ambulatory Visit: Payer: Self-pay

## 2021-05-20 DIAGNOSIS — Z79899 Other long term (current) drug therapy: Secondary | ICD-10-CM | POA: Insufficient documentation

## 2021-05-20 DIAGNOSIS — N481 Balanitis: Secondary | ICD-10-CM | POA: Diagnosis not present

## 2021-05-20 DIAGNOSIS — R21 Rash and other nonspecific skin eruption: Secondary | ICD-10-CM | POA: Diagnosis present

## 2021-05-20 DIAGNOSIS — E119 Type 2 diabetes mellitus without complications: Secondary | ICD-10-CM | POA: Diagnosis not present

## 2021-05-20 DIAGNOSIS — Z7984 Long term (current) use of oral hypoglycemic drugs: Secondary | ICD-10-CM | POA: Diagnosis not present

## 2021-05-20 DIAGNOSIS — Z85038 Personal history of other malignant neoplasm of large intestine: Secondary | ICD-10-CM | POA: Diagnosis not present

## 2021-05-20 DIAGNOSIS — I10 Essential (primary) hypertension: Secondary | ICD-10-CM | POA: Diagnosis not present

## 2021-05-20 LAB — URINALYSIS, ROUTINE W REFLEX MICROSCOPIC
Bilirubin Urine: NEGATIVE
Glucose, UA: >1000 mg/dL — AB
Hgb urine dipstick: NEGATIVE
Ketones, ur: NEGATIVE mg/dL
Leukocytes,Ua: NEGATIVE
Nitrite: NEGATIVE
Protein, ur: NEGATIVE mg/dL
Specific Gravity, Urine: 1.026 (ref 1.005–1.030)
pH: 6.5 (ref 5.0–8.0)

## 2021-05-20 LAB — CBG MONITORING, ED: Glucose-Capillary: 97 mg/dL (ref 70–99)

## 2021-05-20 MED ORDER — CLOTRIMAZOLE 1 % EX CREA: TOPICAL_CREAM | CUTANEOUS | 0 refills | Status: DC

## 2021-05-20 NOTE — ED Notes (Signed)
Dc instructions reviewed with patient. Patient voiced understanding. Dc with belongings.  °

## 2021-05-20 NOTE — Discharge Instructions (Addendum)
Take the cream to help.  Take it for around 5 days.  Also try to keep the area extra clean.  Follow-up with your doctor or your urologist if symptoms do not improve.
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
## 2021-07-24 ENCOUNTER — Other Ambulatory Visit: Payer: Self-pay | Admitting: Orthopaedic Surgery

## 2021-07-24 DIAGNOSIS — M542 Cervicalgia: Secondary | ICD-10-CM

## 2022-02-22 ENCOUNTER — Encounter (HOSPITAL_COMMUNITY): Payer: Self-pay

## 2022-02-22 ENCOUNTER — Other Ambulatory Visit: Payer: Self-pay

## 2022-02-22 ENCOUNTER — Emergency Department (HOSPITAL_COMMUNITY)
Admission: EM | Admit: 2022-02-22 | Discharge: 2022-02-22 | Disposition: A | Discharge status: Home / Self Care | Payer: Medicare PPO | Attending: Emergency Medicine | Admitting: Emergency Medicine

## 2022-02-22 DIAGNOSIS — Z85038 Personal history of other malignant neoplasm of large intestine: Secondary | ICD-10-CM | POA: Insufficient documentation

## 2022-02-22 DIAGNOSIS — T782XXA Anaphylactic shock, unspecified, initial encounter: Secondary | ICD-10-CM | POA: Insufficient documentation

## 2022-02-22 DIAGNOSIS — R0602 Shortness of breath: Secondary | ICD-10-CM | POA: Diagnosis present

## 2022-02-22 MED ORDER — EPINEPHRINE 0.3 MG/0.3ML IJ SOAJ: 0.3000 mg | Freq: Once | INTRAMUSCULAR | 1 refills | Status: AC | PRN

## 2022-02-22 MED ORDER — CETIRIZINE HCL 10 MG PO TABS: 10.0000 mg | ORAL_TABLET | Freq: Every day | ORAL | 0 refills | Status: AC

## 2022-02-22 MED ORDER — PREDNISONE 10 MG (21) PO TBPK: ORAL_TABLET | Freq: Every day | ORAL | 0 refills | Status: AC

## 2022-02-22 NOTE — ED Notes (Signed)
Pt has generalized hives. No oral swelling at this time.  Denies SOB/difficulty breathing. VSS

## 2022-02-22 NOTE — ED Provider Notes (Signed)
  Provider Note MRN:  [id number]  Arrival date & time: 02/22/22    ED Course and Medical Decision Making  Assumed care from [facility] at shift change.  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 Send home w/ anithistamine and steroid taper Encouraged avoidance of known allergens   The patient improved significantly and was discharged in stable condition. Detailed discussions were had with the patient regarding current findings, and need for close f/u with PCP or on call doctor. The patient has been instructed to return immediately if the symptoms worsen in any way for re-evaluation. Patient verbalized understanding and is in agreement with current care plan. All questions answered prior to discharge.    Procedures  Final Clinical Impressions(s) / ED Diagnoses     ICD-10-CM   1. Anaphylaxis, initial encounter  T78.2XXA       ED Discharge Orders          Ordered    predniSONE (STERAPRED UNI-PAK 21 TAB) 10 MG (21) TBPK tablet  Daily        02/22/22 1836    EPINEPHrine 0.3 mg/0.3 mL IJ SOAJ injection  Once PRN        02/22/22 1450    cetirizine (ZYRTEC ALLERGY) 10 MG tablet  Daily        02/22/22 1836              Discharge Instructions      Thank you for coming to [facility] Emergency Department. You were seen for anaphylaxis to a wasp sting. We did an exam and observed you for several hours. You need to carry an EpiPen with you at all times. This is extremely important and could be life-saving for you in the future. We have  prescribed one sent to your pharmacy with a refill. Please follow up with your primary care provider within 1 week.   Do not hesitate to return to the ED or call 911 if you experience: -Worsening symptoms -Wheezing, shortness of breath -Lightheadedness, passing out -Fevers/chills -Anything else that concerns you        Jeanell Sparrow, DO 02/22/22 1839

## 2022-02-22 NOTE — Discharge Instructions (Addendum)
Thank you for coming to [facility] Emergency Department. You were seen for anaphylaxis to a wasp sting. We did an exam and observed you for several hours. You need to carry an EpiPen with you at all times. This is extremely important and could be life-saving for you in the future. We have prescribed one sent to your pharmacy with a refill. Please follow up with your primary care provider within 1 week.   Do not hesitate to return to the ED or call 911 if you experience: -Worsening symptoms -Wheezing, shortness of breath -Lightheadedness, passing out -Fevers/chills -Anything else that concerns you

## 2022-02-22 NOTE — ED Provider Notes (Signed)
[facility] EMERGENCY DEPARTMENT Provider Note   CSN: [id number] Arrival date & time: 02/22/22  1430     History  No chief complaint on file.   Lance Crosby is a 76 y.o. male with a, OA, left rotator cuff tear, remote history of colon cancer presents with allergic reaction.   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 He thinks he was prescribed an EpiPen at some point but didn't get it because it was too expensive.  He felt a sting on his left earlobe and then went inside and noticed his face was swollen.  Began to notice a itchy rash all over his whole body, shortness of breath with wheezing, lightheadedness, and tongue swelling.  He immediately called 911 and noted that the tongue swelling started to get worse while he was awaiting EMS to arrive.  He stated that he could hardly breathe when they finally showed up.  EMS administered '50mg'$  benadryl, 0.3 epi IM, '125mg'$  solumedrol IV. Patient states he very shortly began to feel much better. He currently has only the rash left and some facial swelling, states his tongue is almost back to normal and he denies difficulty breathing.    HPI     Home Medications Prior to Admission medications   Medication Sig Start Date End Date Taking? Authorizing Provider  acetaminophen (TYLENOL) 500 MG tablet Take 500-1,000 mg by mouth daily as needed for mild pain.    [provider]  amLODipine (NORVASC) 10 MG tablet Take 10 mg by mouth daily.    [provider]  atenolol (TENORMIN) 50 MG tablet Take 50 mg by mouth every evening.     [provider]  atorvastatin (LIPITOR) 80 MG tablet Take 80 mg by mouth every evening.     [provider]  clotrimazole (LOTRIMIN) 1 % cream Apply to affected area 2 times daily 05/20/21   Davonna Belling, MD  diphenhydrAMINE (BENADRYL) 25 MG tablet  Take 1 tablet (25 mg total) by mouth every 6 (six) hours. X 3 days then PRN itching, allergic reaction 11/23/14   West, Emily, PA-C  EPINEPHrine 0.3 mg/0.3 mL IJ SOAJ injection Inject 0.3 mg into the muscle once as needed (severe allergic reaction). 02/22/22   Audley Hose, MD  ergocalciferol (VITAMIN D2) 1.25 MG (50000 UT) capsule Take 50,000 Units by mouth once a week.    [provider]  famotidine (PEPCID) 20 MG tablet Take 1 tablet (20 mg total) by mouth 2 (two) times daily. X 3 days than PRN allergic reaction 10/13/19   Hayden Rasmussen, MD  hydrochlorothiazide (HYDRODIURIL) 25 MG tablet Take 25 mg by mouth daily.    [provider]  HYDROcodone-acetaminophen (NORCO/VICODIN) 5-325 MG tablet Take 1 tablet by mouth every 8 (eight) hours as needed for moderate pain. 06/16/20   Meredith Pel, MD  losartan (COZAAR) 100 MG tablet Take 50-100 mg by mouth See admin instructions. Pt suppose to be taking '100mg'$  qd as of 09/18/19 but pt is finishing bottle of '50mg'$  QD explained to pt the dosage was different states he will finish this bottle then get the '100mg'$  09/24/19   [provider]  oxyCODONE-acetaminophen (PERCOCET/ROXICET) 5-325 MG tablet Take 1-2 tablets by mouth every 4 (four) hours as needed for severe pain. 05/26/20   Garald Balding, MD  RYBELSUS 14 MG TABS Take 14 mg  by mouth daily.  09/24/19   [provider]  sitaGLIPtin-metformin (JANUMET) 50-1000 MG tablet Take 1 tablet by mouth 2 (two) times daily with a meal.    [provider]      Allergies    Other    Review of Systems   Review of Systems Review of systems positive for tongue swelling.  A 10 point review of systems was performed and is negative unless otherwise reported in HPI.  Physical Exam Updated Vital Signs BP 133/81 (BP Location: Right Arm)   Pulse 72   Temp 97.6 F (36.4 C) (Oral)   Resp 18   SpO2 98%  Physical Exam General: Normal appearing male/male, lying in bed.   HEENT: Mild facial edema most notable periorbital, Sclera anicteric, MMM, trachea midline. No discernable oropharyngeal edema, uvula midline, tongue appears normal.  Cardiology: RRR, no murmurs/rubs/gallops. BL radial and DP pulses equal bilaterally.  Resp: Normal respiratory rate and effort. CTAB, no wheezes, rhonchi, crackles.  Abd: Soft, non-tender, non-distended. No rebound tenderness or guarding.  MSK: No peripheral edema or signs of trauma. Extremities without deformity or TTP. No cyanosis or clubbing. Skin: warm, dry. Urticaria noted throughout BL UE/LEs and trunk Neuro: A&Ox4, CNs II-XII grossly intact. MAEs. Sensation grossly intact.  Psych: Normal mood and affect.   ED Results / Procedures / Treatments   Labs (all labs ordered are listed, but only abnormal results are displayed) Labs Reviewed - No data to display  EKG None  Radiology No results found.  Procedures Procedures    Medications Ordered in ED Medications - No data to display  ED Course/ Medical Decision Making/ A&P                          Medical Decision Making Risk Prescription drug management.    Patient is HDS, well-appearing. Still has mild facial edema though no oropharyngeal edema and urticaria on extremities and torso.   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  Has positive respite distress, wheezing, oropharyngeal swelling, urticaria, lightheadedness.  Unknown when his blood pressure was at the time but he states that he felt lightheaded.  This is consistent with the presentation of anaphylaxis.  Patient symptoms improved immediately on the administration of epinephrine, methylprednisolone, and Benadryl by EMS.  Patient symptoms have drastically improved and he is now hemodynamically stable.  Patient is not currently in distributive shock, he has no stridor, there is no current respiratory distress necessitating more epinephrine or other  intervention.  Patient is educated on the importance of the epipen and instructed to carry it with him at all times. Patient reports understanding and agrees to carry it with him.   Patient will be observed for a few hours here in the ED to ensure that there is not a biphasic reaction.  Patient is placed on cardiac monitor and pulse ox.  Patient is signed out to Dr Earl Lites who is made aware of his history, presentation, exam, workup, and plan.               Final Clinical Impression(s) / ED Diagnoses Final diagnoses:  Anaphylaxis, initial encounter    Rx / DC Orders ED Discharge Orders          Ordered    EPINEPHrine 0.3 mg/0.3 mL IJ SOAJ injection  Once PRN        02/22/22 1450  This note was created using dictation software, which may contain spelling or grammatical errors.    Audley Hose, MD 02/22/22 [phone]

## 2022-02-22 NOTE — ED Triage Notes (Signed)
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 Pt received '50mg'$  benadryl, 0.3 epi, '125mg'$  solumedrol via EMS. Pt denies difficulty breathing. VSS. Pt noted to have head to toe red raised rash.

## 2022-03-06 ENCOUNTER — Inpatient Hospital Stay (HOSPITAL_COMMUNITY): Payer: Medicare PPO

## 2022-03-06 ENCOUNTER — Emergency Department (HOSPITAL_COMMUNITY): Payer: Medicare PPO

## 2022-03-06 ENCOUNTER — Inpatient Hospital Stay (HOSPITAL_COMMUNITY)
Admission: EM | Admit: 2022-03-06 | Discharge: 2022-03-16 | DRG: 640 | Disposition: E | Payer: Medicare PPO | Attending: Internal Medicine | Admitting: Internal Medicine

## 2022-03-06 ENCOUNTER — Other Ambulatory Visit: Payer: Self-pay

## 2022-03-06 DIAGNOSIS — I10 Essential (primary) hypertension: Secondary | ICD-10-CM | POA: Diagnosis present

## 2022-03-06 DIAGNOSIS — I469 Cardiac arrest, cause unspecified: Secondary | ICD-10-CM | POA: Diagnosis present

## 2022-03-06 DIAGNOSIS — G931 Anoxic brain damage, not elsewhere classified: Secondary | ICD-10-CM | POA: Diagnosis not present

## 2022-03-06 DIAGNOSIS — S12110A Anterior displaced Type II dens fracture, initial encounter for closed fracture: Secondary | ICD-10-CM | POA: Diagnosis present

## 2022-03-06 DIAGNOSIS — E874 Mixed disorder of acid-base balance: Secondary | ICD-10-CM | POA: Diagnosis not present

## 2022-03-06 DIAGNOSIS — E876 Hypokalemia: Principal | ICD-10-CM | POA: Diagnosis present

## 2022-03-06 DIAGNOSIS — Z66 Do not resuscitate: Secondary | ICD-10-CM | POA: Diagnosis not present

## 2022-03-06 DIAGNOSIS — A419 Sepsis, unspecified organism: Secondary | ICD-10-CM | POA: Diagnosis present

## 2022-03-06 DIAGNOSIS — E871 Hypo-osmolality and hyponatremia: Secondary | ICD-10-CM | POA: Diagnosis present

## 2022-03-06 DIAGNOSIS — R739 Hyperglycemia, unspecified: Secondary | ICD-10-CM | POA: Diagnosis not present

## 2022-03-06 DIAGNOSIS — J690 Pneumonitis due to inhalation of food and vomit: Secondary | ICD-10-CM | POA: Diagnosis present

## 2022-03-06 DIAGNOSIS — J80 Acute respiratory distress syndrome: Secondary | ICD-10-CM | POA: Diagnosis present

## 2022-03-06 DIAGNOSIS — G9382 Brain death: Secondary | ICD-10-CM | POA: Diagnosis not present

## 2022-03-06 DIAGNOSIS — Z87891 Personal history of nicotine dependence: Secondary | ICD-10-CM | POA: Diagnosis not present

## 2022-03-06 DIAGNOSIS — I468 Cardiac arrest due to other underlying condition: Secondary | ICD-10-CM | POA: Diagnosis present

## 2022-03-06 DIAGNOSIS — K7200 Acute and subacute hepatic failure without coma: Secondary | ICD-10-CM | POA: Diagnosis present

## 2022-03-06 DIAGNOSIS — Z7984 Long term (current) use of oral hypoglycemic drugs: Secondary | ICD-10-CM

## 2022-03-06 DIAGNOSIS — Z85038 Personal history of other malignant neoplasm of large intestine: Secondary | ICD-10-CM | POA: Diagnosis not present

## 2022-03-06 DIAGNOSIS — R6521 Severe sepsis with septic shock: Secondary | ICD-10-CM | POA: Diagnosis present

## 2022-03-06 DIAGNOSIS — W06XXXA Fall from bed, initial encounter: Secondary | ICD-10-CM | POA: Diagnosis present

## 2022-03-06 DIAGNOSIS — G936 Cerebral edema: Secondary | ICD-10-CM | POA: Diagnosis present

## 2022-03-06 DIAGNOSIS — K219 Gastro-esophageal reflux disease without esophagitis: Secondary | ICD-10-CM | POA: Diagnosis present

## 2022-03-06 DIAGNOSIS — Y92013 Bedroom of single-family (private) house as the place of occurrence of the external cause: Secondary | ICD-10-CM

## 2022-03-06 DIAGNOSIS — E1165 Type 2 diabetes mellitus with hyperglycemia: Secondary | ICD-10-CM | POA: Diagnosis present

## 2022-03-06 DIAGNOSIS — G935 Compression of brain: Secondary | ICD-10-CM | POA: Diagnosis present

## 2022-03-06 DIAGNOSIS — S0081XA Abrasion of other part of head, initial encounter: Secondary | ICD-10-CM | POA: Diagnosis present

## 2022-03-06 DIAGNOSIS — Z529 Donor of unspecified organ or tissue: Secondary | ICD-10-CM | POA: Diagnosis not present

## 2022-03-06 DIAGNOSIS — S12100A Unspecified displaced fracture of second cervical vertebra, initial encounter for closed fracture: Secondary | ICD-10-CM

## 2022-03-06 DIAGNOSIS — N170 Acute kidney failure with tubular necrosis: Secondary | ICD-10-CM | POA: Diagnosis present

## 2022-03-06 DIAGNOSIS — Z79899 Other long term (current) drug therapy: Secondary | ICD-10-CM

## 2022-03-06 DIAGNOSIS — M1990 Unspecified osteoarthritis, unspecified site: Secondary | ICD-10-CM | POA: Diagnosis present

## 2022-03-06 DIAGNOSIS — J9602 Acute respiratory failure with hypercapnia: Secondary | ICD-10-CM | POA: Diagnosis not present

## 2022-03-06 DIAGNOSIS — J9601 Acute respiratory failure with hypoxia: Secondary | ICD-10-CM | POA: Diagnosis not present

## 2022-03-06 DIAGNOSIS — E8721 Acute metabolic acidosis: Secondary | ICD-10-CM | POA: Diagnosis present

## 2022-03-06 DIAGNOSIS — R4182 Altered mental status, unspecified: Secondary | ICD-10-CM | POA: Diagnosis not present

## 2022-03-06 LAB — COMPREHENSIVE METABOLIC PANEL
ALT: 268 U/L — ABNORMAL HIGH (ref 0–44)
AST: 305 U/L — ABNORMAL HIGH (ref 15–41)
Albumin: 2.8 g/dL — ABNORMAL LOW (ref 3.5–5.0)
Alkaline Phosphatase: 145 U/L — ABNORMAL HIGH (ref 38–126)
Anion gap: 22 — ABNORMAL HIGH (ref 5–15)
BUN: 18 mg/dL (ref 8–23)
CO2: 16 mmol/L — ABNORMAL LOW (ref 22–32)
Calcium: 11.4 mg/dL — ABNORMAL HIGH (ref 8.9–10.3)
Chloride: 100 mmol/L (ref 98–111)
Creatinine, Ser: 1.8 mg/dL — ABNORMAL HIGH (ref 0.61–1.24)
GFR, Estimated: 39 mL/min — ABNORMAL LOW (ref >60)
Glucose, Bld: 751 mg/dL (ref 70–99)
Potassium: 4.1 mmol/L (ref 3.5–5.1)
Sodium: 138 mmol/L (ref 135–145)
Total Bilirubin: 0.7 mg/dL (ref 0.3–1.2)
Total Protein: 5.1 g/dL — ABNORMAL LOW (ref 6.5–8.1)

## 2022-03-06 LAB — LACTIC ACID, PLASMA
Lactic Acid, Venous: >9 mmol/L (ref 0.5–1.9)
Lactic Acid, Venous: 7.5 mmol/L (ref 0.5–1.9)

## 2022-03-06 LAB — CBC WITH DIFFERENTIAL/PLATELET
Abs Immature Granulocytes: 0.93 10*3/uL — ABNORMAL HIGH (ref 0.00–0.07)
Basophils Absolute: 0.1 10*3/uL (ref 0.0–0.1)
Basophils Relative: 0 %
Eosinophils Absolute: 0.1 10*3/uL (ref 0.0–0.5)
Eosinophils Relative: 0 %
HCT: 38.8 % — ABNORMAL LOW (ref 39.0–52.0)
Hemoglobin: 12.3 g/dL — ABNORMAL LOW (ref 13.0–17.0)
Immature Granulocytes: 5 %
Lymphocytes Relative: 30 %
Lymphs Abs: 5.2 10*3/uL — ABNORMAL HIGH (ref 0.7–4.0)
MCH: 30.4 pg (ref 26.0–34.0)
MCHC: 31.7 g/dL (ref 30.0–36.0)
MCV: 95.8 fL (ref 80.0–100.0)
Monocytes Absolute: 1.2 10*3/uL — ABNORMAL HIGH (ref 0.1–1.0)
Monocytes Relative: 7 %
Neutro Abs: 10 10*3/uL — ABNORMAL HIGH (ref 1.7–7.7)
Neutrophils Relative %: 58 %
Platelets: 178 10*3/uL (ref 150–400)
RBC: 4.05 MIL/uL — ABNORMAL LOW (ref 4.22–5.81)
RDW: 14.1 % (ref 11.5–15.5)
WBC: 17.4 10*3/uL — ABNORMAL HIGH (ref 4.0–10.5)
nRBC: 0 % (ref 0.0–0.2)

## 2022-03-06 LAB — I-STAT CHEM 8, ED
BUN: 19 mg/dL (ref 8–23)
Calcium, Ion: 1.16 mmol/L (ref 1.15–1.40)
Chloride: 102 mmol/L (ref 98–111)
Creatinine, Ser: 1.3 mg/dL — ABNORMAL HIGH (ref 0.61–1.24)
Glucose, Bld: >700 mg/dL (ref 70–99)
HCT: 38 % — ABNORMAL LOW (ref 39.0–52.0)
Hemoglobin: 12.9 g/dL — ABNORMAL LOW (ref 13.0–17.0)
Potassium: 4.4 mmol/L (ref 3.5–5.1)
Sodium: 135 mmol/L (ref 135–145)
TCO2: 19 mmol/L — ABNORMAL LOW (ref 22–32)

## 2022-03-06 LAB — CBC
HCT: 43.6 % (ref 39.0–52.0)
Hemoglobin: 14.7 g/dL (ref 13.0–17.0)
MCH: 30.2 pg (ref 26.0–34.0)
MCHC: 33.7 g/dL (ref 30.0–36.0)
MCV: 89.5 fL (ref 80.0–100.0)
Platelets: 233 10*3/uL (ref 150–400)
RBC: 4.87 MIL/uL (ref 4.22–5.81)
RDW: 14.3 % (ref 11.5–15.5)
WBC: 5.4 10*3/uL (ref 4.0–10.5)
nRBC: 0 % (ref 0.0–0.2)

## 2022-03-06 LAB — POCT I-STAT 7, (LYTES, BLD GAS, ICA,H+H)
Acid-base deficit: 13 mmol/L — ABNORMAL HIGH (ref 0.0–2.0)
Bicarbonate: 15.4 mmol/L — ABNORMAL LOW (ref 20.0–28.0)
Calcium, Ion: 1.35 mmol/L (ref 1.15–1.40)
HCT: 43 % (ref 39.0–52.0)
Hemoglobin: 14.6 g/dL (ref 13.0–17.0)
O2 Saturation: 83 %
Patient temperature: 33.7
Potassium: 4.8 mmol/L (ref 3.5–5.1)
Sodium: 134 mmol/L — ABNORMAL LOW (ref 135–145)
TCO2: 17 mmol/L — ABNORMAL LOW (ref 22–32)
pCO2 arterial: 37.2 mmHg (ref 32–48)
pH, Arterial: 7.205 — ABNORMAL LOW (ref 7.35–7.45)
pO2, Arterial: 48 mmHg — ABNORMAL LOW (ref 83–108)

## 2022-03-06 LAB — CBG MONITORING, ED
Glucose-Capillary: >600 mg/dL (ref 70–99)
Glucose-Capillary: 591 mg/dL (ref 70–99)

## 2022-03-06 LAB — I-STAT ARTERIAL BLOOD GAS, ED
Acid-base deficit: 13 mmol/L — ABNORMAL HIGH (ref 0.0–2.0)
Bicarbonate: 16.4 mmol/L — ABNORMAL LOW (ref 20.0–28.0)
Calcium, Ion: 1.29 mmol/L (ref 1.15–1.40)
HCT: 38 % — ABNORMAL LOW (ref 39.0–52.0)
Hemoglobin: 12.9 g/dL — ABNORMAL LOW (ref 13.0–17.0)
O2 Saturation: 100 %
Patient temperature: 98.8
Potassium: 4.6 mmol/L (ref 3.5–5.1)
Sodium: 134 mmol/L — ABNORMAL LOW (ref 135–145)
TCO2: 18 mmol/L — ABNORMAL LOW (ref 22–32)
pCO2 arterial: 52.6 mmHg — ABNORMAL HIGH (ref 32–48)
pH, Arterial: 7.103 — CL (ref 7.35–7.45)
pO2, Arterial: 274 mmHg — ABNORMAL HIGH (ref 83–108)

## 2022-03-06 LAB — GLUCOSE, CAPILLARY
Glucose-Capillary: >600 mg/dL (ref 70–99)
Glucose-Capillary: >600 mg/dL (ref 70–99)
Glucose-Capillary: 543 mg/dL (ref 70–99)

## 2022-03-06 LAB — ETHANOL: Alcohol, Ethyl (B): <10 mg/dL (ref <10)

## 2022-03-06 LAB — TROPONIN I (HIGH SENSITIVITY)
Troponin I (High Sensitivity): 18 ng/L — ABNORMAL HIGH (ref <18)
Troponin I (High Sensitivity): 54 ng/L — ABNORMAL HIGH (ref <18)

## 2022-03-06 MED ORDER — MAGNESIUM SULFATE 2 GM/50ML IV SOLN
2.0000 g | Freq: Once | INTRAVENOUS | Status: AC | PRN
  Administered 2022-03-08: 2 g via INTRAVENOUS
  Filled 2022-03-06: qty 50

## 2022-03-06 MED ORDER — FAMOTIDINE 20 MG PO TABS
20.0000 mg | ORAL_TABLET | Freq: Two times a day (BID) | ORAL | Status: DC
  Administered 2022-03-06 – 2022-03-08 (×4): 20 mg
  Filled 2022-03-06 (×4): qty 1

## 2022-03-06 MED ORDER — FENTANYL CITRATE PF 50 MCG/ML IJ SOSY: 50.0000 ug | PREFILLED_SYRINGE | INTRAMUSCULAR | Status: DC | PRN

## 2022-03-06 MED ORDER — ACETAMINOPHEN 325 MG PO TABS: 650.0000 mg | ORAL_TABLET | ORAL | Status: DC | PRN

## 2022-03-06 MED ORDER — CHLORHEXIDINE GLUCONATE CLOTH 2 % EX PADS
6.0000 | MEDICATED_PAD | Freq: Every day | CUTANEOUS | Status: DC
  Administered 2022-03-06 – 2022-03-10 (×5): 6 via TOPICAL

## 2022-03-06 MED ORDER — NOREPINEPHRINE 4 MG/250ML-% IV SOLN: 2.0000 ug/min | INTRAVENOUS | Status: DC

## 2022-03-06 MED ORDER — POLYETHYLENE GLYCOL 3350 17 G PO PACK
17.0000 g | PACK | Freq: Every day | ORAL | Status: DC
  Administered 2022-03-08: 17 g
  Filled 2022-03-06: qty 1

## 2022-03-06 MED ORDER — CALCIUM CHLORIDE 10 % IV SOLN
INTRAVENOUS | Status: DC | PRN
  Administered 2022-03-06: 1 g via INTRAVENOUS

## 2022-03-06 MED ORDER — NOREPINEPHRINE 4 MG/250ML-% IV SOLN
0.0000 ug/min | INTRAVENOUS | Status: DC
  Filled 2022-03-06: qty 250

## 2022-03-06 MED ORDER — PANTOPRAZOLE SODIUM 40 MG PO TBEC: 40.0000 mg | DELAYED_RELEASE_TABLET | Freq: Every day | ORAL | Status: DC

## 2022-03-06 MED ORDER — SODIUM CHLORIDE 0.9% FLUSH: 10.0000 mL | INTRAVENOUS | Status: DC | PRN

## 2022-03-06 MED ORDER — SODIUM CHLORIDE 0.9 % IV SOLN
250.0000 mL | INTRAVENOUS | Status: DC
  Administered 2022-03-09: 250 mL via INTRAVENOUS

## 2022-03-06 MED ORDER — ACETAMINOPHEN 325 MG PO TABS
650.0000 mg | ORAL_TABLET | ORAL | Status: AC
  Filled 2022-03-06: qty 2

## 2022-03-06 MED ORDER — SODIUM CHLORIDE 0.9 % IV SOLN: 3.0000 g | Freq: Four times a day (QID) | INTRAVENOUS | Status: DC

## 2022-03-06 MED ORDER — BUSPIRONE HCL 10 MG PO TABS: 30.0000 mg | ORAL_TABLET | Freq: Three times a day (TID) | ORAL | Status: AC | PRN

## 2022-03-06 MED ORDER — SODIUM CHLORIDE 0.9 % IV SOLN
1.5000 g | Freq: Four times a day (QID) | INTRAVENOUS | Status: DC
  Administered 2022-03-06 – 2022-03-08 (×8): 1.5 g via INTRAVENOUS
  Filled 2022-03-06 (×10): qty 4

## 2022-03-06 MED ORDER — ACETAMINOPHEN 160 MG/5ML PO SOLN
650.0000 mg | ORAL | Status: AC
  Administered 2022-03-07 – 2022-03-08 (×7): 650 mg
  Filled 2022-03-06 (×6): qty 20.3

## 2022-03-06 MED ORDER — DEXTROSE 50 % IV SOLN: 0.0000 mL | INTRAVENOUS | Status: DC | PRN

## 2022-03-06 MED ORDER — STERILE WATER FOR INJECTION IV SOLN
INTRAVENOUS | Status: DC
  Filled 2022-03-06 (×3): qty 1000

## 2022-03-06 MED ORDER — ACETAMINOPHEN 650 MG RE SUPP
650.0000 mg | RECTAL | Status: DC | PRN
  Administered 2022-03-09: 650 mg via RECTAL
  Filled 2022-03-06: qty 1

## 2022-03-06 MED ORDER — NOREPINEPHRINE 4 MG/250ML-% IV SOLN
2.0000 ug/min | INTRAVENOUS | Status: DC
  Administered 2022-03-06: 2 ug/min via INTRAVENOUS
  Filled 2022-03-06 (×2): qty 250

## 2022-03-06 MED ORDER — INSULIN REGULAR(HUMAN) IN NACL 100-0.9 UT/100ML-% IV SOLN
INTRAVENOUS | Status: DC
  Administered 2022-03-06: 13 [IU]/h via INTRAVENOUS
  Administered 2022-03-06: 17 [IU]/h via INTRAVENOUS
  Administered 2022-03-07 (×2): 8.5 [IU]/h via INTRAVENOUS
  Filled 2022-03-06 (×2): qty 100

## 2022-03-06 MED ORDER — HEPARIN SODIUM (PORCINE) 5000 UNIT/ML IJ SOLN
5000.0000 [IU] | Freq: Three times a day (TID) | INTRAMUSCULAR | Status: DC
  Administered 2022-03-06: 5000 [IU] via SUBCUTANEOUS
  Filled 2022-03-06: qty 1

## 2022-03-06 MED ORDER — ONDANSETRON HCL 4 MG/2ML IJ SOLN: 4.0000 mg | Freq: Four times a day (QID) | INTRAMUSCULAR | Status: DC | PRN

## 2022-03-06 MED ORDER — POLYETHYLENE GLYCOL 3350 17 G PO PACK: 17.0000 g | PACK | Freq: Every day | ORAL | Status: DC | PRN

## 2022-03-06 MED ORDER — CHLORHEXIDINE GLUCONATE CLOTH 2 % EX PADS: 6.0000 | MEDICATED_PAD | Freq: Every day | CUTANEOUS | Status: DC

## 2022-03-06 MED ORDER — DOCUSATE SODIUM 50 MG/5ML PO LIQD: 100.0000 mg | Freq: Two times a day (BID) | ORAL | Status: DC

## 2022-03-06 MED ORDER — SODIUM CHLORIDE 0.9 % IV SOLN
250.0000 mL | INTRAVENOUS | Status: DC
  Administered 2022-03-09: 10 mL/h via INTRAVENOUS

## 2022-03-06 MED ORDER — ORAL CARE MOUTH RINSE
15.0000 mL | OROMUCOSAL | Status: DC
  Administered 2022-03-06 – 2022-03-08 (×21): 15 mL via OROMUCOSAL

## 2022-03-06 MED ORDER — ACETAMINOPHEN 160 MG/5ML PO SOLN: 650.0000 mg | ORAL | Status: DC | PRN

## 2022-03-06 MED ORDER — ORAL CARE MOUTH RINSE: 15.0000 mL | OROMUCOSAL | Status: DC | PRN

## 2022-03-06 MED ORDER — PANTOPRAZOLE SODIUM 40 MG IV SOLR
40.0000 mg | Freq: Every day | INTRAVENOUS | Status: DC
  Administered 2022-03-06 – 2022-03-07 (×2): 40 mg via INTRAVENOUS
  Filled 2022-03-06 (×2): qty 10

## 2022-03-06 MED ORDER — ACETAMINOPHEN 650 MG RE SUPP
650.0000 mg | RECTAL | Status: AC
  Administered 2022-03-07: 650 mg via RECTAL
  Filled 2022-03-06: qty 1

## 2022-03-06 MED ORDER — SODIUM CHLORIDE 0.9 % IV BOLUS
1000.0000 mL | Freq: Once | INTRAVENOUS | Status: AC
  Administered 2022-03-06: 1000 mL via INTRAVENOUS

## 2022-03-06 MED ORDER — SODIUM CHLORIDE 0.9% FLUSH
10.0000 mL | Freq: Two times a day (BID) | INTRAVENOUS | Status: DC
  Administered 2022-03-07: 10 mL
  Administered 2022-03-07: 40 mL
  Administered 2022-03-07: 10 mL

## 2022-03-06 MED ORDER — DOCUSATE SODIUM 100 MG PO CAPS: 100.0000 mg | ORAL_CAPSULE | Freq: Two times a day (BID) | ORAL | Status: DC | PRN

## 2022-03-06 NOTE — Progress Notes (Addendum)
Pharmacy Antibiotic Note  Lance Crosby is a 76 y.o. male admitted on 02/18/2022 with  aspiration pneumonia .  Pharmacy has been consulted for Unasyn dosing.  Patient presenting to ED unresponsive after being found in PEA arrest. Was intubated and started on vasopressor support. CT scan shows pulmonary edema and RUL opacity. Team would like to start Unasyn for aspiration pneumonia coverage.    Plan: Give Unasyn 1.5g IV q6 hours Monitor clinical status, renal function, culture data, and LOT  Height: '5\' 9"'$  (175.3 cm) Weight: 99.8 kg (220 lb) IBW/kg (Calculated) : 70.7  No data recorded.  Recent Labs  Lab 03/05/2022 1856 02/17/2022 1857  WBC 17.4*  --   CREATININE 1.80* 1.30*  LATICACIDVEN >9.0*  --     Estimated Creatinine Clearance: 56.3 mL/min (A) (by C-G formula based on SCr of 1.3 mg/dL (H)).    Allergies  Allergen Reactions   Other Anaphylaxis    Pt states "steroids caused increase in blood sugar."     Antimicrobials this admission: Unasyn 11/21 >>  Dose adjustments this admission: N/A  Microbiology results: 11/21 Bcx:   Thank you for allowing pharmacy to be a part of this patient's care.  Louanne Belton, PharmD, [facility] PGY1 Pharmacy Resident 02/19/2022 8:39 PM

## 2022-03-06 NOTE — ED Provider Notes (Signed)
[facility] EMERGENCY DEPARTMENT Provider Note   CSN: [id number] Arrival date & time: 02/18/2022  1843     History  No chief complaint on file.   Lance Crosby is a 76 y.o. male.  HPI Patient presents after post CPR with return of vitals.  Discussed with patient's wife and sister later.  Patient has been feeling weak last couple days.  Had gone to the bathroom and then went to the bed.  Later after about 10 minutes of being seen laying in bed wife heard a thump and found him on the ground.  Unresponsive.  911 called and daughter came and started CPR.  For EMS had a couple rounds of CPR.  Had a few epis.  Ended up being paced for bradycardia.  Sugar high for EMS.  On epi drip upon arrival.   Past Medical History:  Diagnosis Date   Arthritis    Claustrophobia    Colon cancer ([place]) 2000   colon cancer   Diabetes mellitus without complication ([place])    GERD (gastroesophageal reflux disease)    if eats late    Hypertension     Home Medications Prior to Admission medications   Medication Sig Start Date End Date Taking? Authorizing Provider  acetaminophen (TYLENOL) 500 MG tablet Take 500-1,000 mg by mouth daily as needed for mild pain.    [provider]  amLODipine (NORVASC) 10 MG tablet Take 10 mg by mouth daily.    [provider]  atenolol (TENORMIN) 50 MG tablet Take 50 mg by mouth every evening.     [provider]  atorvastatin (LIPITOR) 80 MG tablet Take 80 mg by mouth every evening.     [provider]  cetirizine (ZYRTEC ALLERGY) 10 MG tablet Take 1 tablet (10 mg total) by mouth daily for 14 days. 02/22/22 02/25/2022  Jeanell Sparrow, DO  clotrimazole (LOTRIMIN) 1 % cream Apply to affected area 2 times daily 05/20/21   Davonna Belling, MD  diphenhydrAMINE (BENADRYL) 25 MG tablet Take 1 tablet (25 mg total) by mouth every 6 (six) hours. X 3 days then PRN itching, allergic reaction 11/23/14   West, Emily, PA-C  EPINEPHrine 0.3  mg/0.3 mL IJ SOAJ injection Inject 0.3 mg into the muscle once as needed (severe allergic reaction). 02/22/22   Audley Hose, MD  ergocalciferol (VITAMIN D2) 1.25 MG (50000 UT) capsule Take 50,000 Units by mouth once a week.    [provider]  famotidine (PEPCID) 20 MG tablet Take 1 tablet (20 mg total) by mouth 2 (two) times daily. X 3 days than PRN allergic reaction 10/13/19   Hayden Rasmussen, MD  hydrochlorothiazide (HYDRODIURIL) 25 MG tablet Take 25 mg by mouth daily.    [provider]  HYDROcodone-acetaminophen (NORCO/VICODIN) 5-325 MG tablet Take 1 tablet by mouth every 8 (eight) hours as needed for moderate pain. 06/16/20   Meredith Pel, MD  losartan (COZAAR) 100 MG tablet Take 50-100 mg by mouth See admin instructions. Pt suppose to be taking '100mg'$  qd as of 09/18/19 but pt is finishing bottle of '50mg'$  QD explained to pt the dosage was different states he will finish this bottle then get the '100mg'$  09/24/19   [provider]  oxyCODONE-acetaminophen (PERCOCET/ROXICET) 5-325 MG tablet Take 1-2 tablets by mouth every 4 (four) hours as needed for severe pain. 05/26/20   Garald Balding, MD  predniSONE (STERAPRED UNI-PAK 21 TAB) 10 MG (21) TBPK tablet Take by mouth daily. Take  6 tabs by mouth daily  for 2 days, then 5 tabs for 2 days, then 4 tabs for 2 days, then 3 tabs for 2 days, 2 tabs for 2 days, then 1 tab by mouth daily for 2 days 02/23/22   Wynona Dove A, DO  RYBELSUS 14 MG TABS Take 14 mg by mouth daily.  09/24/19   [provider]  sitaGLIPtin-metformin (JANUMET) 50-1000 MG tablet Take 1 tablet by mouth 2 (two) times daily with a meal.    [provider]      Allergies    Other    Review of Systems   Review of Systems  Physical Exam Updated Vital Signs BP 96/75   Pulse 78   Temp (!) 93.4 F (34.1 C)   Resp (!) 28   Ht '5\' 9"'$  (1.753 m)   Wt 99.8 kg   SpO2 94%   BMI 32.49 kg/m  Physical Exam Vitals and nursing note reviewed.   Eyes:     Comments: Pupils minimally responsive.  Neck:     Comments: Cervical collar in place. Pulmonary:     Breath sounds: No wheezing or rhonchi.  Abdominal:     General: There is no distension.  Musculoskeletal:     Comments: I/O line into right tibia.  Somewhat shallow.  Neurological:     Comments: Unresponsive.  Not breathing.  King airway in place.     ED Results / Procedures / Treatments   Labs (all labs ordered are listed, but only abnormal results are displayed) Labs Reviewed  CBC WITH DIFFERENTIAL/PLATELET - Abnormal; Notable for the following components:      Result Value   WBC 17.4 (*)    RBC 4.05 (*)    Hemoglobin 12.3 (*)    HCT 38.8 (*)    Neutro Abs 10.0 (*)    Lymphs Abs 5.2 (*)    Monocytes Absolute 1.2 (*)    Abs Immature Granulocytes 0.93 (*)    All other components within normal limits  COMPREHENSIVE METABOLIC PANEL - Abnormal; Notable for the following components:   CO2 16 (*)    Glucose, Bld 751 (*)    Creatinine, Ser 1.80 (*)    Calcium 11.4 (*)    Total Protein 5.1 (*)    Albumin 2.8 (*)    AST 305 (*)    ALT 268 (*)    Alkaline Phosphatase 145 (*)    GFR, Estimated 39 (*)    Anion gap 22 (*)    All other components within normal limits  LACTIC ACID, PLASMA - Abnormal; Notable for the following components:   Lactic Acid, Venous >9.0 (*)    All other components within normal limits  LACTIC ACID, PLASMA - Abnormal; Notable for the following components:   Lactic Acid, Venous 7.5 (*)    All other components within normal limits  GLUCOSE, CAPILLARY - Abnormal; Notable for the following components:   Glucose-Capillary >600 (*)    All other components within normal limits  GLUCOSE, CAPILLARY - Abnormal; Notable for the following components:   Glucose-Capillary >600 (*)    All other components within normal limits  I-STAT CHEM 8, ED - Abnormal; Notable for the following components:   Creatinine, Ser 1.30 (*)    Glucose, Bld >700 (*)     TCO2 19 (*)    Hemoglobin 12.9 (*)    HCT 38.0 (*)    All other components within normal limits  I-STAT ARTERIAL BLOOD GAS, ED - Abnormal; Notable  for the following components:   pH, Arterial 7.103 (*)    pCO2 arterial 52.6 (*)    pO2, Arterial 274 (*)    Bicarbonate 16.4 (*)    TCO2 18 (*)    Acid-base deficit 13.0 (*)    Sodium 134 (*)    HCT 38.0 (*)    Hemoglobin 12.9 (*)    All other components within normal limits  CBG MONITORING, ED - Abnormal; Notable for the following components:   Glucose-Capillary >600 (*)    All other components within normal limits  CBG MONITORING, ED - Abnormal; Notable for the following components:   Glucose-Capillary 591 (*)    All other components within normal limits  POCT I-STAT 7, (LYTES, BLD GAS, ICA,H+H) - Abnormal; Notable for the following components:   pH, Arterial 7.205 (*)    pO2, Arterial 48 (*)    Bicarbonate 15.4 (*)    TCO2 17 (*)    Acid-base deficit 13.0 (*)    Sodium 134 (*)    All other components within normal limits  TROPONIN I (HIGH SENSITIVITY) - Abnormal; Notable for the following components:   Troponin I (High Sensitivity) 18 (*)    All other components within normal limits  TROPONIN I (HIGH SENSITIVITY) - Abnormal; Notable for the following components:   Troponin I (High Sensitivity) 54 (*)    All other components within normal limits  CULTURE, BLOOD (ROUTINE X 2)  CULTURE, BLOOD (ROUTINE X 2)  ETHANOL  CBC  URINE DRUGS OF ABUSE SCREEN W ALC, ROUTINE (REF LAB)  CBC  COMPREHENSIVE METABOLIC PANEL  MAGNESIUM  PHOSPHORUS  BLOOD GAS, ARTERIAL  BETA-HYDROXYBUTYRIC ACID  LACTIC ACID, PLASMA    EKG None  Radiology DG Abd 1 View  Result Date: 02/23/2022 CLINICAL DATA:  Nasogastric tube placement. EXAM: ABDOMEN - 1 VIEW COMPARISON:  None Available. FINDINGS: Tip and side port of the enteric tube below the diaphragm in the stomach. There is a right femoral catheter with tip in the expected location of the right  common iliac vessels. Generalized paucity of bowel gas in the abdomen. IMPRESSION: Tip and side port of the enteric tube below the diaphragm in the stomach. Right femoral catheter with tip in the region of the right common iliac vessels. Electronically Signed   By: Keith Rake M.D.   On: 03/14/2022 21:39   DG Hip Unilat W or Wo Pelvis 2-3 Views Left  Result Date: 02/28/2022 CLINICAL DATA:  Status post fall. EXAM: DG HIP (WITH OR WITHOUT PELVIS) 2-3V LEFT COMPARISON:  None Available. FINDINGS: Technically limited due to positioning. No evidence of fracture or dislocation. Left hip degenerative change with joint space narrowing and acetabular spurring. Pubic rami are grossly intact. IMPRESSION: No fracture or dislocation of the left hip. Left hip degenerative change. Electronically Signed   By: Keith Rake M.D.   On: 02/27/2022 21:37   DG Pelvis Portable  Result Date: 02/24/2022 CLINICAL DATA:  fall EXAM: PORTABLE PELVIS 1-2 VIEWS COMPARISON:  X-ray pelvis 03/22/2014 FINDINGS: Limited evaluation due to overlapping osseous structures and overlying soft tissues. There is no evidence of pelvic fracture or diastasis. No acute displaced fracture or dislocation of the visualized hips on frontal view. Lower lumbar spine demonstrates degenerative changes. No pelvic bone lesions are seen. IMPRESSION: Negative for acute traumatic injury. Limited evaluation due to overlapping osseous structures and overlying soft tissues. Electronically Signed   By: Iven Finn M.D.   On: 03/03/2022 20:02   CT Cervical Spine Wo Contrast  Result Date: 02/23/2022 CLINICAL DATA:  Neck trauma, intoxicated or obtunded (Age >= 16y) EXAM: CT CERVICAL SPINE WITHOUT CONTRAST TECHNIQUE: Multidetector CT imaging of the cervical spine was performed without intravenous contrast. Multiplanar CT image reconstructions were also generated. RADIATION DOSE REDUCTION: This exam was performed according to the departmental  dose-optimization program which includes automated exposure control, adjustment of the mA and/or kV according to patient size and/or use of iterative reconstruction technique. COMPARISON:  None Available. FINDINGS: Alignment: Broad-based reversal of normal lordosis. Skull base and vertebrae: Oblique mildly displaced type 2 dens fracture. There is posterior retropulsion of approximately 2-3 mm. No posterior element involvement. No additional fracture. Soft tissues and spinal canal: No significant canal hematoma related to dens fracture. Disc levels: Disc space narrowing and spurring C4-C5, C5-C6 and C6-C7. multilevel facet hypertrophy. Upper chest: Dependent consolidation in the right upper lobe. Additional patchy ground-glass opacities within both upper lobes. Slight septal thickening. Other: Enteric tube appears to be looped in the mouth, although incompletely included in the field of view. IMPRESSION: 1. Oblique mildly displaced type 2 dens fracture with posterior retropulsion of approximately 2-3 mm. 2. Multilevel degenerative disc disease and facet hypertrophy. 3. Enteric tube appears to be looped in the mouth, although incompletely included in the field of view. 4. Dependent right upper lobe opacity may represent aspiration. Additional patchy ground-glass opacities within both upper lobes. Septal thickening suggest pulmonary edema. These results were called by telephone at the time of interpretation on 02/18/2022 at 7:58 pm to provider Davonna Belling , who verbally acknowledged these results. Electronically Signed   By: Keith Rake M.D.   On: 03/09/2022 19:59   CT HEAD WO CONTRAST (5MM)  Result Date: 02/27/2022 CLINICAL DATA:  Head trauma, moderate-severe Fall out of bed. Post CPR. EXAM: CT HEAD WITHOUT CONTRAST TECHNIQUE: Contiguous axial images were obtained from the base of the skull through the vertex without intravenous contrast. RADIATION DOSE REDUCTION: This exam was performed according to  the departmental dose-optimization program which includes automated exposure control, adjustment of the mA and/or kV according to patient size and/or use of iterative reconstruction technique. COMPARISON:  None Available. FINDINGS: Brain: Head tilted in the scanner which limits detailed assessment. No intracranial hemorrhage. There is a questionable area of gray-white differentiation loss in the left temporal lobe, series 3, image 16 and series 5, image 25. Gray-white differentiation is otherwise preserved. No subdural or extra-axial collection. No hydrocephalus. Vascular: Atherosclerosis of skullbase vasculature without hyperdense vessel or abnormal calcification. Skull: No fracture or focal lesion. Sinuses/Orbits: Mucosal thickening throughout the paranasal sinuses may be related to intubation. No mastoid effusion. Other: None. IMPRESSION: 1. Questionable area of gray-white differentiation loss in the left temporal lobe, may be artifactual due to head tilt, however possibility of acute ischemia is also considered. Consider further evaluation with MRI. 2. No intracranial hemorrhage. Electronically Signed   By: Keith Rake M.D.   On: 02/19/2022 19:51   DG Chest Portable 1 View  Result Date: 03/07/2022 CLINICAL DATA:  cardiac arrest EXAM: PORTABLE CHEST 1 VIEW COMPARISON:  Chest x-ray 03/22/2014, chest x-ray 12/31/2006 FINDINGS: Endotracheal tube with tip approximately 2 cm above the carina. Enteric tube coursing along the thoracic spine mid line with tip in the region of the expected distal esophagus and side port along overlying the expected region of the midesophagus. Cardiac paddles overlie the patient. The heart and mediastinal contours are unchanged. Prominent hilar vasculature. No focal consolidation. Increased interstitial markings. No pleural effusion. No pneumothorax. No acute osseous abnormality-Limited  evaluation due to overlapping osseous structures and overlying soft tissues. IMPRESSION: 1.  Mild pulmonary edema. 2. Enteric tube in esophagus. Recommend advancing enteric tube by 12 cm. Please follow-up with repeat chest x-ray. 3. Endotracheal tube 2 cm above the carina. Electronically Signed   By: Iven Finn M.D.   On: 02/16/2022 19:37    Procedures Procedures    Medications Ordered in ED Medications  calcium chloride injection (1 g Intravenous Given 03/15/2022 1853)  insulin regular, human (MYXREDLIN) 100 units/ 100 mL infusion ( Intravenous Infusion Verify 03/09/2022 2300)  dextrose 50 % solution 0-50 mL (has no administration in time range)  famotidine (PEPCID) tablet 20 mg (20 mg Per Tube Given 03/05/2022 2213)  Oral care mouth rinse (15 mLs Mouth Rinse Not Given 02/27/2022 2200)  Oral care mouth rinse (has no administration in time range)  polyethylene glycol (MIRALAX / GLYCOLAX) packet 17 g (17 g Per Tube Not Given 03/05/2022 2007)  ondansetron (ZOFRAN) injection 4 mg (has no administration in time range)  acetaminophen (TYLENOL) tablet 650 mg (has no administration in time range)    Or  acetaminophen (TYLENOL) 160 MG/5ML solution 650 mg (has no administration in time range)    Or  acetaminophen (TYLENOL) suppository 650 mg (has no administration in time range)  acetaminophen (TYLENOL) tablet 650 mg (has no administration in time range)    Or  acetaminophen (TYLENOL) 160 MG/5ML solution 650 mg (has no administration in time range)    Or  acetaminophen (TYLENOL) suppository 650 mg (has no administration in time range)  busPIRone (BUSPAR) tablet 30 mg (has no administration in time range)    Or  busPIRone (BUSPAR) tablet 30 mg (has no administration in time range)  magnesium sulfate IVPB 2 g 50 mL (has no administration in time range)  0.9 %  sodium chloride infusion ( Intravenous Infusion Verify 02/26/2022 2300)  heparin injection 5,000 Units (5,000 Units Subcutaneous Given 03/02/2022 2233)  fentaNYL (SUBLIMAZE) injection 50 mcg (has no administration in time range)  fentaNYL  (SUBLIMAZE) injection 50-200 mcg (has no administration in time range)  docusate sodium (COLACE) capsule 100 mg (has no administration in time range)  polyethylene glycol (MIRALAX / GLYCOLAX) packet 17 g (has no administration in time range)  pantoprazole (PROTONIX) injection 40 mg (40 mg Intravenous Given 02/25/2022 2212)  0.9 %  sodium chloride infusion (has no administration in time range)  norepinephrine (LEVOPHED) '4mg'$  in 268m (0.016 mg/mL) premix infusion (6 mcg/min Intravenous Infusion Verify 02/28/2022 2300)  ampicillin-sulbactam (UNASYN) 1.5 g in sodium chloride 0.9 % 100 mL IVPB (0 g Intravenous Stopped 02/24/2022 2241)  sodium chloride flush (NS) 0.9 % injection 10-40 mL (has no administration in time range)  sodium chloride flush (NS) 0.9 % injection 10-40 mL (has no administration in time range)  Chlorhexidine Gluconate Cloth 2 % PADS 6 each (has no administration in time range)  sodium chloride 0.9 % bolus 1,000 mL (0 mLs Intravenous Stopped 02/16/2022 2022)    ED Course/ Medical Decision Making/ A&P                           Medical Decision Making Amount and/or Complexity of Data Reviewed Labs: ordered. Radiology: ordered.  Risk Prescription drug management. Decision regarding hospitalization.   Patient presented after cardiac arrest.  CPR been started pretty quickly by family.  Had a pulse reportedly for EMS but lost and had pacing.  While in the ER after given calcium and bicarb  patient no longer required the pacing.  Native rate was greater than the pacing rate.  Found to be hyperglycemic.  Although does not appear to be in renal failure.  Normal potassium.  Chest x-ray showed good intubation but did have shallow OG tube.  Head CT done and reassuring but does have type II dens fracture.  Discussed with Dr. Tamala Julian from ICU who will be admitting patient.  Initial pelvic film had potential abnormality of left hip although potentially just rotated.  Will get dedicated x-ray to  evaluate.  Intubated and cervical spine precautions maintained.  I discussed with the patient's wife and sister.  Would want everything done.  Discussed with Dr. Marcello Moores from neurosurgery who will follow patient.  Collar for now.  CRITICAL CARE Performed by: Davonna Belling Total critical care time: 30 minutes Critical care time was exclusive of separately billable procedures and treating other patients. Critical care was necessary to treat or prevent imminent or life-threatening deterioration. Critical care was time spent personally by me on the following activities: development of treatment plan with patient and/or surrogate as well as nursing, discussions with consultants, evaluation of patient's response to treatment, examination of patient, obtaining history from patient or surrogate, ordering and performing treatments and interventions, ordering and review of laboratory studies, ordering and review of radiographic studies, pulse oximetry and re-evaluation of patient's condition.         Final Clinical Impression(s) / ED Diagnoses Final diagnoses:  Cardiac arrest ([place])  Closed odontoid fracture, initial encounter ([place])  Hyperglycemia    Rx / DC Orders ED Discharge Orders     None         Davonna Belling, MD 03/04/2022 2314

## 2022-03-06 NOTE — Procedures (Signed)
Arterial Catheter Insertion Procedure Note  Lance Crosby  [id number]  July 22, 1945  Date:02/16/2022  Time:8:32 PM    Provider Performing: Candee Furbish    Procedure: Insertion of Arterial Line [phone]) without US guidance  Indication(s) Blood pressure monitoring and/or need for frequent ABGs  Consent Unable to obtain consent due to emergent nature of procedure.  Anesthesia None   Time Out Verified patient identification, verified procedure, site/side was marked, verified correct patient position, special equipment/implants available, medications/allergies/relevant history reviewed, required imaging and test results available.   Sterile Technique Maximal sterile technique including full sterile barrier drape, hand hygiene, sterile gown, sterile gloves, mask, hair covering, sterile ultrasound probe cover (if used).   Procedure Description Area of catheter insertion was cleaned with chlorhexidine and draped in sterile fashion. Without real-time ultrasound guidance an arterial catheter was placed into the right radial artery.  Appropriate arterial tracings confirmed on monitor.     Complications/Tolerance None; patient tolerated the procedure well.   EBL Minimal   Specimen(s) None

## 2022-03-06 NOTE — Procedures (Signed)
Central Venous Catheter Insertion Procedure Note  Lance Crosby  [id number]  Dec 28, 1945  Date:02/14/2022  Time:8:32 PM   Provider Performing:Laura Gleason PA  Procedure: Insertion of Non-tunneled Central Venous Catheter(36556) with US guidance ([id number])   Indication(s) Medication administration  Consent Unable to obtain consent due to emergent nature of procedure.  Anesthesia Topical only with 1% lidocaine   Timeout Verified patient identification, verified procedure, site/side was marked, verified correct patient position, special equipment/implants available, medications/allergies/relevant history reviewed, required imaging and test results available.  Sterile Technique Maximal sterile technique including full sterile barrier drape, hand hygiene, sterile gown, sterile gloves, mask, hair covering, sterile ultrasound probe cover (if used).  Procedure Description Area of catheter insertion was cleaned with chlorhexidine and draped in sterile fashion.  With real-time ultrasound guidance a central venous catheter was placed into the right femoral vein. Nonpulsatile blood flow and easy flushing noted in all ports.  The catheter was sutured in place and sterile dressing applied.  Complications/Tolerance None; patient tolerated the procedure well. Chest X-ray is ordered to verify placement for internal jugular or subclavian cannulation.   Chest x-ray is not ordered for femoral cannulation.  EBL Minimal  Specimen(s) None

## 2022-03-06 NOTE — Progress Notes (Signed)
Per M.D. central line to be placed.

## 2022-03-06 NOTE — Code Documentation (Signed)
Epi Drip started at 84mg/min

## 2022-03-06 NOTE — ED Triage Notes (Addendum)
Patient coming in from home. Patient had a fall out of bed. Patient was found to be getting bagged by EMS. Patient then went asystole and then PEA. Patient had CPR done for approx 15-20 minutes with a total of 4 doses of epi given. Patient gained pulses back and was NSR. Patient had another 2 minutes of CPR and another dose of epi. Patient coming to ED paced and being bagged by king tube

## 2022-03-06 NOTE — Progress Notes (Signed)
Chaplain responded to call for support of family whose loved one had been brought in by ambulance. Chaplain was liaison between ED and family. Chaplain present with family during physician report.  Chaplain escorted family to [facility] waiting area.  Chaplain available throughout the night if needed.  [place]

## 2022-03-06 NOTE — Progress Notes (Signed)
Family updated in consult room (wife, children).  Erskine Emery MD PCCM

## 2022-03-06 NOTE — Progress Notes (Signed)
EEG complete - results pending 

## 2022-03-06 NOTE — ED Notes (Signed)
OG tube removed, new one inserted, +auscultation, repeat xray to be done.

## 2022-03-06 NOTE — Progress Notes (Signed)
Pt transported from [facility] to [place] by RN and RT w/o complications

## 2022-03-06 NOTE — H&P (Addendum)
NAME:  JERY HOLLERN, MRN:  [id number], DOB:  [birth year], LOS: 0 ADMISSION DATE:  03/05/2022, CONSULTATION DATE:  02/26/2022 REFERRING MD:  EDP, CHIEF COMPLAINT:  PEA arrest   History of Present Illness:  Abdifatah Colquhoun is a 76 y.o. M with PMH of colon Ca, DM, GERD, HTN, arthritis with recent ED visit for bee sting on prednisone taper who was brought in by EMS unresponsive after his wife heard him fall out of bed.  He had been complaining of generalized weakness for two days prior.   He was found in PEA arrest, (seems to have been at least 10 mins before chest compressions started) initially coded 15-20 minutes then an additional 2 minutes.  He was intubated without medication and brought to the ED where he required pacing initially then epinephrine gtt.    CT head/C-spine and xray pelvis significant for L temporal lobe gray/white matter in the L temporal lobe, type 2 dens fracture with retropulsion 2-28m, likely pulmonary edema and RUL opacity.   Labs significant for Glucose >700, CMP pending, lactic acid >9, WBC 17k, creatinine 1.3.  PCCM consulted for admission.  Pertinent  Medical History   has a past medical history of Arthritis, Claustrophobia, [place]), [place], GERD (gastroesophageal reflux disease), and Hypertension.   Significant Hospital Events: Including procedures, antibiotic start and stop dates in addition to other pertinent events   11/21 PEA arrest, intubated, on epi gtt, PCCM admit  Interim History / Subjective:  Remains unresppnsive  Objective   Blood pressure 123/75, pulse 87, resp. rate (!) 21, height '5\' 9"'$  (1.753 m), weight 99.8 kg, SpO2 98 %.    Vent Mode: PRVC FiO2 (%):  [100 %] 100 % Set Rate:  [18 bmp] 18 bmp Vt Set:  [560 mL] 560 mL PEEP:  [5 cmH20] 5 cmH20 Plateau Pressure:  [24 cmH20] 24 cmH20  No intake or output data in the 24 hours ending 02/25/2022 1956 Filed Weights   02/28/2022 1851  Weight: 99.8  kg    General:  critically ill-appearing M, intubated and unresponsive HEENT: MM pink/moist, sclera anicteric, pupils unresponsive bilaterally, superficial abrasions to face, Neuro: no sedation and unresponsive to pain, pupils blown bilaterally, no doll's eye and no gag CV: s1s2 rrr, no m/r/g PULM:  mechanically ventilated without significant rhonchi or wheezing  GI: soft, mildly distended Extremities: warm/dry, no edema  Skin: no rashes or lesions  Resolved Hospital Problem list     Assessment & Plan:  OHCA- 15-20 mins CPR, EKG nonischemic, bedside echo grossly preserved, required pacing until received bicarb. Post arrest encephalopathy- current neuro exam concerning, CT equivocal for grey-white loss in temporal lobe Post arrest hypoxemic resp failure- some e/o aspiration on CT neck Type II Dens fracture- NSGY consulted Recent admit for bee sting- given course of steroids, glucoses pretty high on admit, ABG pending DM2 with hyperglycemia Hx HTN Post arrest AKI Shock, distributive vs. cardiogenic   - TTM2, heavy sedation x 48h then slow rewarming - Endotool - Stat MRI brain, C spine - C collar, NSGY consult: EDP Dr. PAlvino Chapeldiscussed w/ Dr. TMarcello Mooreswatch in C collar for now, they will follow - Vent support - ? ortho consult, could be angle of his pelvis X ray but L femoral neck looks abnormal: addendum, looks like it was just the angle, no hip fx - Norepi peripheral protocol - Echo/trops stat -EEG -place CVC and continue epinephrine vs. Leophed -trend lactic  -IVF  - Unasyn  Best Practice (right click and "Reselect all SmartList Selections" daily)   Diet/type: NPO DVT prophylaxis: prophylactic heparin  GI prophylaxis: PPI Lines: Central line Foley:  Yes, and it is still needed Code Status:  full code Last date of multidisciplinary goals of care discussion [EDP spoke with wife, full code and aggressive care]  Labs   CBC: Recent Labs  Lab 03/04/2022 1856  03/13/2022 1857  WBC 17.4*  --   NEUTROABS 10.0*  --   HGB 12.3* 12.9*  HCT 38.8* 38.0*  MCV 95.8  --   PLT 178  --     Basic Metabolic Panel: Recent Labs  Lab 03/03/2022 1857  NA 135  K 4.4  CL 102  GLUCOSE >700*  BUN 19  CREATININE 1.30*   GFR: Estimated Creatinine Clearance: 56.3 mL/min (A) (by C-G formula based on SCr of 1.3 mg/dL (H)). Recent Labs  Lab 03/15/2022 1856  WBC 17.4*    Liver Function Tests: No results for input(s): "AST", "ALT", "ALKPHOS", "BILITOT", "PROT", "ALBUMIN" in the last 168 hours. No results for input(s): "LIPASE", "AMYLASE" in the last 168 hours. No results for input(s): "AMMONIA" in the last 168 hours.  ABG    Component Value Date/Time   TCO2 19 (L) 02/24/2022 1857     Coagulation Profile: No results for input(s): "INR", "PROTIME" in the last 168 hours.  Cardiac Enzymes: No results for input(s): "CKTOTAL", "CKMB", "CKMBINDEX", "TROPONINI" in the last 168 hours.  HbA1C: Hgb A1c MFr Bld  Date/Time Value Ref Range Status  06/08/2019 03:05 PM 8.8 (H) 4.8 - 5.6 % Final    Comment:    (NOTE) Pre diabetes:          5.7%-6.4% Diabetes:              >6.4% Glycemic control for   <7.0% adults with diabetes     CBG: Recent Labs  Lab 03/02/2022 1849  GLUCAP >600*    Review of Systems:   Unable to obtain  Past Medical History:  He,  has a past medical history of Arthritis, Claustrophobia, Colon cancer ([place]) (2000), Diabetes mellitus without complication ([place]), GERD (gastroesophageal reflux disease), and Hypertension.   Surgical History:   Past Surgical History:  Procedure Laterality Date   COLON SURGERY  02/1999   COLONOSCOPY     FINGER SURGERY     thumb surgery- September 2016   KNEE SURGERY     POLYPECTOMY     ROTATOR CUFF REPAIR       Social History:   reports that he quit smoking about 48 years ago. His smoking use included cigarettes. He has a 5.00 pack-year smoking history. He has never used smokeless tobacco. He  reports that he does not drink alcohol and does not use drugs.   Family History:  His family history is negative for Colon cancer, Esophageal cancer, Rectal cancer, Stomach cancer, and Colon polyps.   Allergies Allergies  Allergen Reactions   Other Anaphylaxis    Pt states "steroids caused increase in blood sugar."      Home Medications  Prior to Admission medications   Medication Sig Start Date End Date Taking? Authorizing Provider  acetaminophen (TYLENOL) 500 MG tablet Take 500-1,000 mg by mouth daily as needed for mild pain.    [provider]  amLODipine (NORVASC) 10 MG tablet Take 10 mg by mouth daily.    [provider]  atenolol (TENORMIN) 50 MG tablet Take 50 mg by mouth every evening.     [provider]  atorvastatin (LIPITOR) 80 MG tablet Take 80 mg by mouth every evening.     [provider]  cetirizine (ZYRTEC ALLERGY) 10 MG tablet Take 1 tablet (10 mg total) by mouth daily for 14 days. 02/22/22 03/07/2022  Jeanell Sparrow, DO  clotrimazole (LOTRIMIN) 1 % cream Apply to affected area 2 times daily 05/20/21   Davonna Belling, MD  diphenhydrAMINE (BENADRYL) 25 MG tablet Take 1 tablet (25 mg total) by mouth every 6 (six) hours. X 3 days then PRN itching, allergic reaction 11/23/14   West, Emily, PA-C  EPINEPHrine 0.3 mg/0.3 mL IJ SOAJ injection Inject 0.3 mg into the muscle once as needed (severe allergic reaction). 02/22/22   Audley Hose, MD  ergocalciferol (VITAMIN D2) 1.25 MG (50000 UT) capsule Take 50,000 Units by mouth once a week.    [provider]  famotidine (PEPCID) 20 MG tablet Take 1 tablet (20 mg total) by mouth 2 (two) times daily. X 3 days than PRN allergic reaction 10/13/19   Hayden Rasmussen, MD  hydrochlorothiazide (HYDRODIURIL) 25 MG tablet Take 25 mg by mouth daily.    [provider]  HYDROcodone-acetaminophen (NORCO/VICODIN) 5-325 MG tablet Take 1 tablet by mouth every 8 (eight) hours as needed for moderate  pain. 06/16/20   Meredith Pel, MD  losartan (COZAAR) 100 MG tablet Take 50-100 mg by mouth See admin instructions. Pt suppose to be taking '100mg'$  qd as of 09/18/19 but pt is finishing bottle of '50mg'$  QD explained to pt the dosage was different states he will finish this bottle then get the '100mg'$  09/24/19   [provider]  oxyCODONE-acetaminophen (PERCOCET/ROXICET) 5-325 MG tablet Take 1-2 tablets by mouth every 4 (four) hours as needed for severe pain. 05/26/20   Garald Balding, MD  predniSONE (STERAPRED UNI-PAK 21 TAB) 10 MG (21) TBPK tablet Take by mouth daily. Take 6 tabs by mouth daily  for 2 days, then 5 tabs for 2 days, then 4 tabs for 2 days, then 3 tabs for 2 days, 2 tabs for 2 days, then 1 tab by mouth daily for 2 days 02/23/22   Wynona Dove A, DO  RYBELSUS 14 MG TABS Take 14 mg by mouth daily.  09/24/19   [provider]  sitaGLIPtin-metformin (JANUMET) 50-1000 MG tablet Take 1 tablet by mouth 2 (two) times daily with a meal.    [provider]     Combined CC time: 85 minutes independent of procedures  CRITICAL CARE Performed by: Otilio Carpen Gleason Erskine Emery    Critical care time was exclusive of separately billable procedures and treating other patients.  Critical care was necessary to treat or prevent imminent or life-threatening deterioration.  Critical care was time spent personally by me on the following activities: development of treatment plan with patient and/or surrogate as well as nursing, discussions with consultants, evaluation of patient's response to treatment, examination of patient, obtaining history from patient or surrogate, ordering and performing treatments and interventions, ordering and review of laboratory studies, ordering and review of radiographic studies, pulse oximetry and re-evaluation of patient's condition.   Otilio Carpen Gleason, PA-C Erskine Emery MD [facility] See Amion for pager If no response to pager ,  please call [phone] until 7pm After 7:00 pm call Elink  [phone]?[place]

## 2022-03-06 NOTE — ED Provider Notes (Signed)
  Physical Exam  BP 123/75   Pulse 87   Resp (!) 21   Ht '5\' 9"'$  (1.753 m)   Wt 99.8 kg   SpO2 98%   BMI 32.49 kg/m   Physical Exam  Procedures  Procedure Name: Intubation Date/Time: 02/28/2022 8:26 PM  Performed by: Dorothyann Peng, PA-CPre-anesthesia Checklist: Patient identified, Emergency Drugs available, Suction available, Patient being monitored and Timeout performed Oxygen Delivery Method: Ambu bag Preoxygenation: Pre-oxygenation with 100% oxygen Laryngoscope Size: 5 and Glidescope Grade View: Grade III Tube size: 7.5 mm Number of attempts: 1 Airway Equipment and Method: Stylet Placement Confirmation: ETT inserted through vocal cords under direct vision, CO2 detector and Breath sounds checked- equal and bilateral Secured at: 26 cm Tube secured with: ETT holder Dental Injury: Teeth and Oropharynx as per pre-operative assessment       ED Course / MDM    Medical Decision Making Amount and/or Complexity of Data Reviewed Labs: ordered. Radiology: ordered.  Risk Prescription drug management. Decision regarding hospitalization.         Ronny Bacon 03/03/2022 2029    Davonna Belling, MD 02/18/2022 2350

## 2022-03-06 NOTE — Code Documentation (Signed)
Patient is no longer being paced

## 2022-03-06 NOTE — Progress Notes (Signed)
Pt transported from [facility] to CT1 and back by RN and RT w/o complications.

## 2022-03-06 NOTE — Progress Notes (Signed)
eLink Physician-Brief Progress Note Patient Name: Lance Crosby DOB: 1946/04/13 MRN: [id number]   Date of Service  02/18/2022  HPI/Events of Note  Hypotension - Patient now has central venous line. Nursing request to change Norepinephrine IV infusion from PIV to central line.   eICU Interventions  Will change Norepinephrine IV infusion from PIV to central line dosing.      Intervention Category Major Interventions: Hypotension - evaluation and management  Lysle Dingwall 03/07/2022, 11:44 PM

## 2022-03-07 ENCOUNTER — Inpatient Hospital Stay (HOSPITAL_COMMUNITY): Payer: Medicare PPO

## 2022-03-07 DIAGNOSIS — R4182 Altered mental status, unspecified: Secondary | ICD-10-CM

## 2022-03-07 DIAGNOSIS — J9602 Acute respiratory failure with hypercapnia: Secondary | ICD-10-CM

## 2022-03-07 DIAGNOSIS — J9601 Acute respiratory failure with hypoxia: Secondary | ICD-10-CM

## 2022-03-07 DIAGNOSIS — I469 Cardiac arrest, cause unspecified: Secondary | ICD-10-CM | POA: Diagnosis not present

## 2022-03-07 DIAGNOSIS — R739 Hyperglycemia, unspecified: Secondary | ICD-10-CM

## 2022-03-07 LAB — POCT I-STAT 7, (LYTES, BLD GAS, ICA,H+H)
Acid-base deficit: 11 mmol/L — ABNORMAL HIGH (ref 0.0–2.0)
Acid-base deficit: 2 mmol/L (ref 0.0–2.0)
Acid-base deficit: 3 mmol/L — ABNORMAL HIGH (ref 0.0–2.0)
Acid-base deficit: 3 mmol/L — ABNORMAL HIGH (ref 0.0–2.0)
Bicarbonate: 17.7 mmol/L — ABNORMAL LOW (ref 20.0–28.0)
Bicarbonate: 24.1 mmol/L (ref 20.0–28.0)
Bicarbonate: 22.3 mmol/L (ref 20.0–28.0)
Bicarbonate: 21.4 mmol/L (ref 20.0–28.0)
Calcium, Ion: 1.38 mmol/L (ref 1.15–1.40)
Calcium, Ion: 1.21 mmol/L (ref 1.15–1.40)
Calcium, Ion: 1.22 mmol/L (ref 1.15–1.40)
Calcium, Ion: 1.14 mmol/L — ABNORMAL LOW (ref 1.15–1.40)
HCT: 46 % (ref 39.0–52.0)
HCT: 39 % (ref 39.0–52.0)
HCT: 41 % (ref 39.0–52.0)
HCT: 42 % (ref 39.0–52.0)
Hemoglobin: 15.6 g/dL (ref 13.0–17.0)
Hemoglobin: 13.3 g/dL (ref 13.0–17.0)
Hemoglobin: 13.9 g/dL (ref 13.0–17.0)
Hemoglobin: 14.3 g/dL (ref 13.0–17.0)
O2 Saturation: 97 %
O2 Saturation: 98 %
O2 Saturation: 98 %
O2 Saturation: 97 %
Patient temperature: 36.6
Patient temperature: 36.2
Patient temperature: 37
Patient temperature: 37.1
Potassium: 3 mmol/L — ABNORMAL LOW (ref 3.5–5.1)
Potassium: 2.7 mmol/L — CL (ref 3.5–5.1)
Potassium: 3 mmol/L — ABNORMAL LOW (ref 3.5–5.1)
Potassium: 3.8 mmol/L (ref 3.5–5.1)
Sodium: 143 mmol/L (ref 135–145)
Sodium: 147 mmol/L — ABNORMAL HIGH (ref 135–145)
Sodium: 146 mmol/L — ABNORMAL HIGH (ref 135–145)
Sodium: 142 mmol/L (ref 135–145)
TCO2: 19 mmol/L — ABNORMAL LOW (ref 22–32)
TCO2: 26 mmol/L (ref 22–32)
TCO2: 23 mmol/L (ref 22–32)
TCO2: 22 mmol/L (ref 22–32)
pCO2 arterial: 49.2 mmHg — ABNORMAL HIGH (ref 32–48)
pCO2 arterial: 45.3 mmHg (ref 32–48)
pCO2 arterial: 39.5 mmHg (ref 32–48)
pCO2 arterial: 37.1 mmHg (ref 32–48)
pH, Arterial: 7.162 — CL (ref 7.35–7.45)
pH, Arterial: 7.331 — ABNORMAL LOW (ref 7.35–7.45)
pH, Arterial: 7.359 (ref 7.35–7.45)
pH, Arterial: 7.37 (ref 7.35–7.45)
pO2, Arterial: 116 mmHg — ABNORMAL HIGH (ref 83–108)
pO2, Arterial: 119 mmHg — ABNORMAL HIGH (ref 83–108)
pO2, Arterial: 101 mmHg (ref 83–108)
pO2, Arterial: 92 mmHg (ref 83–108)

## 2022-03-07 LAB — ECHOCARDIOGRAM COMPLETE
Height: 69 in
S' Lateral: 3.25 cm
Weight: 2920.65 oz

## 2022-03-07 LAB — CBC
HCT: 38.2 % — ABNORMAL LOW (ref 39.0–52.0)
Hemoglobin: 13.6 g/dL (ref 13.0–17.0)
MCH: 30.1 pg (ref 26.0–34.0)
MCHC: 35.6 g/dL (ref 30.0–36.0)
MCV: 84.5 fL (ref 80.0–100.0)
Platelets: 205 10*3/uL (ref 150–400)
RBC: 4.52 MIL/uL (ref 4.22–5.81)
RDW: 14.3 % (ref 11.5–15.5)
WBC: 4.5 10*3/uL (ref 4.0–10.5)
nRBC: 0.7 % — ABNORMAL HIGH (ref 0.0–0.2)

## 2022-03-07 LAB — BASIC METABOLIC PANEL
Anion gap: 18 — ABNORMAL HIGH (ref 5–15)
BUN: 19 mg/dL (ref 8–23)
CO2: 21 mmol/L — ABNORMAL LOW (ref 22–32)
Calcium: 8.4 mg/dL — ABNORMAL LOW (ref 8.9–10.3)
Chloride: 106 mmol/L (ref 98–111)
Creatinine, Ser: 1.64 mg/dL — ABNORMAL HIGH (ref 0.61–1.24)
GFR, Estimated: 43 mL/min — ABNORMAL LOW (ref >60)
Glucose, Bld: 138 mg/dL — ABNORMAL HIGH (ref 70–99)
Potassium: 3.1 mmol/L — ABNORMAL LOW (ref 3.5–5.1)
Sodium: 145 mmol/L (ref 135–145)

## 2022-03-07 LAB — COMPREHENSIVE METABOLIC PANEL
ALT: 226 U/L — ABNORMAL HIGH (ref 0–44)
AST: 204 U/L — ABNORMAL HIGH (ref 15–41)
Albumin: 2.4 g/dL — ABNORMAL LOW (ref 3.5–5.0)
Alkaline Phosphatase: 93 U/L (ref 38–126)
Anion gap: 9 (ref 5–15)
BUN: 20 mg/dL (ref 8–23)
CO2: 29 mmol/L (ref 22–32)
Calcium: 8 mg/dL — ABNORMAL LOW (ref 8.9–10.3)
Chloride: 111 mmol/L (ref 98–111)
Creatinine, Ser: 1.72 mg/dL — ABNORMAL HIGH (ref 0.61–1.24)
GFR, Estimated: 41 mL/min — ABNORMAL LOW (ref >60)
Glucose, Bld: 253 mg/dL — ABNORMAL HIGH (ref 70–99)
Potassium: 2.7 mmol/L — CL (ref 3.5–5.1)
Sodium: 149 mmol/L — ABNORMAL HIGH (ref 135–145)
Total Bilirubin: 0.5 mg/dL (ref 0.3–1.2)
Total Protein: 4.5 g/dL — ABNORMAL LOW (ref 6.5–8.1)

## 2022-03-07 LAB — LACTIC ACID, PLASMA
Lactic Acid, Venous: 4.7 mmol/L (ref 0.5–1.9)
Lactic Acid, Venous: 5.3 mmol/L (ref 0.5–1.9)
Lactic Acid, Venous: 6.4 mmol/L (ref 0.5–1.9)

## 2022-03-07 LAB — GLUCOSE, CAPILLARY
Glucose-Capillary: 131 mg/dL — ABNORMAL HIGH (ref 70–99)
Glucose-Capillary: 146 mg/dL — ABNORMAL HIGH (ref 70–99)
Glucose-Capillary: 150 mg/dL — ABNORMAL HIGH (ref 70–99)
Glucose-Capillary: 154 mg/dL — ABNORMAL HIGH (ref 70–99)
Glucose-Capillary: 154 mg/dL — ABNORMAL HIGH (ref 70–99)
Glucose-Capillary: 154 mg/dL — ABNORMAL HIGH (ref 70–99)
Glucose-Capillary: 158 mg/dL — ABNORMAL HIGH (ref 70–99)
Glucose-Capillary: 161 mg/dL — ABNORMAL HIGH (ref 70–99)
Glucose-Capillary: 165 mg/dL — ABNORMAL HIGH (ref 70–99)
Glucose-Capillary: 167 mg/dL — ABNORMAL HIGH (ref 70–99)
Glucose-Capillary: 170 mg/dL — ABNORMAL HIGH (ref 70–99)
Glucose-Capillary: 184 mg/dL — ABNORMAL HIGH (ref 70–99)
Glucose-Capillary: 187 mg/dL — ABNORMAL HIGH (ref 70–99)
Glucose-Capillary: 258 mg/dL — ABNORMAL HIGH (ref 70–99)
Glucose-Capillary: 359 mg/dL — ABNORMAL HIGH (ref 70–99)
Glucose-Capillary: 415 mg/dL — ABNORMAL HIGH (ref 70–99)
Glucose-Capillary: 466 mg/dL — ABNORMAL HIGH (ref 70–99)

## 2022-03-07 LAB — MAGNESIUM: Magnesium: 1.6 mg/dL — ABNORMAL LOW (ref 1.7–2.4)

## 2022-03-07 LAB — POTASSIUM: Potassium: 3.8 mmol/L (ref 3.5–5.1)

## 2022-03-07 LAB — BETA-HYDROXYBUTYRIC ACID: Beta-Hydroxybutyric Acid: 0.12 mmol/L (ref 0.05–0.27)

## 2022-03-07 LAB — PHOSPHORUS: Phosphorus: 4.1 mg/dL (ref 2.5–4.6)

## 2022-03-07 MED ORDER — MIDAZOLAM HCL 2 MG/2ML IJ SOLN
2.0000 mg | Freq: Once | INTRAMUSCULAR | Status: AC
  Administered 2022-03-07: 2 mg via INTRAVENOUS
  Filled 2022-03-07: qty 2

## 2022-03-07 MED ORDER — POTASSIUM CHLORIDE 10 MEQ/50ML IV SOLN
INTRAVENOUS | Status: AC
  Administered 2022-03-07: 10 meq via INTRAVENOUS
  Filled 2022-03-07: qty 200

## 2022-03-07 MED ORDER — MAGNESIUM SULFATE 2 GM/50ML IV SOLN
2.0000 g | Freq: Once | INTRAVENOUS | Status: AC
  Administered 2022-03-07: 2 g via INTRAVENOUS
  Filled 2022-03-07: qty 50

## 2022-03-07 MED ORDER — EPINEPHRINE HCL 5 MG/250ML IV SOLN IN NS
0.5000 ug/min | INTRAVENOUS | Status: DC
  Administered 2022-03-07: 1 ug/min via INTRAVENOUS
  Filled 2022-03-07 (×2): qty 250

## 2022-03-07 MED ORDER — VASOPRESSIN 20 UNITS/100 ML INFUSION FOR SHOCK
0.0000 [IU]/min | INTRAVENOUS | Status: DC
  Administered 2022-03-07: 0.03 [IU]/min via INTRAVENOUS
  Administered 2022-03-07: 0.04 [IU]/min via INTRAVENOUS
  Filled 2022-03-07 (×2): qty 100

## 2022-03-07 MED ORDER — NOREPINEPHRINE 16 MG/250ML-% IV SOLN
0.0000 ug/min | INTRAVENOUS | Status: DC
  Administered 2022-03-07: 20 ug/min via INTRAVENOUS
  Administered 2022-03-07: 46 ug/min via INTRAVENOUS
  Administered 2022-03-07: 10 ug/min via INTRAVENOUS
  Administered 2022-03-07: 33 ug/min via INTRAVENOUS
  Administered 2022-03-08: 32 ug/min via INTRAVENOUS
  Administered 2022-03-08 – 2022-03-09 (×3): 40 ug/min via INTRAVENOUS
  Administered 2022-03-09: 20 ug/min via INTRAVENOUS
  Administered 2022-03-09: 40 ug/min via INTRAVENOUS
  Filled 2022-03-07 (×11): qty 250

## 2022-03-07 MED ORDER — SODIUM BICARBONATE 8.4 % IV SOLN
INTRAVENOUS | Status: AC
  Administered 2022-03-07: 100 meq via INTRAVENOUS
  Filled 2022-03-07: qty 50

## 2022-03-07 MED ORDER — POTASSIUM CHLORIDE 10 MEQ/50ML IV SOLN
10.0000 meq | INTRAVENOUS | Status: AC
  Administered 2022-03-07 (×3): 10 meq via INTRAVENOUS

## 2022-03-07 MED ORDER — SODIUM CHLORIDE 0.9 % IV BOLUS
1000.0000 mL | Freq: Once | INTRAVENOUS | Status: AC
  Administered 2022-03-07: 1000 mL via INTRAVENOUS

## 2022-03-07 MED ORDER — LACTATED RINGERS IV BOLUS: 1000.0000 mL | Freq: Once | INTRAVENOUS | Status: DC

## 2022-03-07 MED ORDER — ROCURONIUM BROMIDE 10 MG/ML (PF) SYRINGE: 50.0000 mg | PREFILLED_SYRINGE | INTRAVENOUS | Status: DC | PRN

## 2022-03-07 MED ORDER — NOREPINEPHRINE 4 MG/250ML-% IV SOLN
INTRAVENOUS | Status: AC
  Filled 2022-03-07: qty 250

## 2022-03-07 MED ORDER — VASOPRESSIN 20 UNITS/100 ML INFUSION FOR SHOCK
INTRAVENOUS | Status: AC
  Administered 2022-03-07: 0.03 [IU]/min via INTRAVENOUS
  Filled 2022-03-07: qty 100

## 2022-03-07 MED ORDER — SODIUM BICARBONATE 8.4 % IV SOLN
100.0000 meq | Freq: Once | INTRAVENOUS | Status: AC
  Administered 2022-03-07: 100 meq via INTRAVENOUS

## 2022-03-07 MED ORDER — ROCURONIUM BROMIDE 10 MG/ML (PF) SYRINGE
50.0000 mg | PREFILLED_SYRINGE | Freq: Once | INTRAVENOUS | Status: AC
  Administered 2022-03-07: 50 mg via INTRAVENOUS
  Filled 2022-03-07: qty 10

## 2022-03-07 MED ORDER — SODIUM BICARBONATE 8.4 % IV SOLN: 100.0000 meq | Freq: Once | INTRAVENOUS | Status: AC

## 2022-03-07 MED ORDER — HYDROCORTISONE SOD SUC (PF) 100 MG IJ SOLR
100.0000 mg | Freq: Two times a day (BID) | INTRAMUSCULAR | Status: DC
  Administered 2022-03-07 – 2022-03-08 (×3): 100 mg via INTRAVENOUS
  Filled 2022-03-07 (×4): qty 2

## 2022-03-07 MED ORDER — POTASSIUM CHLORIDE 10 MEQ/50ML IV SOLN
10.0000 meq | INTRAVENOUS | Status: AC
  Administered 2022-03-07 (×4): 10 meq via INTRAVENOUS
  Filled 2022-03-07 (×2): qty 50

## 2022-03-07 MED ORDER — FENTANYL CITRATE PF 50 MCG/ML IJ SOSY
50.0000 ug | PREFILLED_SYRINGE | Freq: Once | INTRAMUSCULAR | Status: AC
  Administered 2022-03-07: 50 ug via INTRAVENOUS
  Filled 2022-03-07: qty 1

## 2022-03-07 NOTE — Progress Notes (Signed)
eLink Physician-Brief Progress Note Patient Name: Lance Crosby DOB: [birth year] MRN: [id number]   Date of Service  03/07/2022  HPI/Events of Note  Nursing reports bleeding from R nares.  eICU Interventions  Plan: Hold 6 AM Heparin  dose.      Intervention Category Major Interventions: Other:  Lysle Dingwall 03/07/2022, 4:42 AM

## 2022-03-07 NOTE — Progress Notes (Addendum)
[place] Progress Note Patient Name: Lance Crosby DOB: May 14, 1945 MRN: [id number]   Date of Service  03/07/2022  HPI/Events of Note  Hypotension - BP = 69/43 with MAP = 50. CVP = 4. Norepinephrine requirement has increased from 6 mcg/min to 19 mcg/min. Beta-Hydroxybutyric Acid = 0.12: therefore, no evidence for DKA.  eICU Interventions  Plan: Bolus with 0.9 NaCl 1 liter IV over 1 hour now.  ABG STAT.     Intervention Category Major Interventions: Hypotension - evaluation and management  Coral Timme Eugene 03/07/2022, 1:55 AM

## 2022-03-07 NOTE — Progress Notes (Incomplete)
Echocardiogram 2D Echocardiogram has been performed.  Lance Crosby 03/07/2022, 12:53 PM

## 2022-03-07 NOTE — Progress Notes (Signed)
NAME:  Lance Crosby, MRN:  [id number], DOB:  12-Aug-1945, LOS: 1 ADMISSION DATE:  02/14/2022, CONSULTATION DATE:  03/07/22 REFERRING MD:  EDP, CHIEF COMPLAINT:  PEA arrest   History of Present Illness:  Teruo Stilley is a 76 y.o. M with PMH of colon Ca, DM, GERD, HTN, arthritis with recent ED visit for bee sting on prednisone taper who was brought in by EMS unresponsive after his wife heard him fall out of bed.  He had been complaining of generalized weakness for two days prior.   He was found in PEA arrest, (seems to have been at least 10 mins before chest compressions started) initially coded 15-20 minutes then an additional 2 minutes.  He was intubated without medication and brought to the ED where he required pacing initially then epinephrine gtt.    CT head/C-spine and xray pelvis significant for L temporal lobe gray/white matter in the L temporal lobe, type 2 dens fracture with retropulsion 2-40m, likely pulmonary edema and RUL opacity.   Labs significant for Glucose >700, CMP pending, lactic acid >9, WBC 17k, creatinine 1.3.  PCCM consulted for admission.  Pertinent  Medical History   has a past medical history of Arthritis, Claustrophobia, [place], GERD (gastroesophageal reflux disease), and Hypertension.   Significant Hospital Events: Including procedures, antibiotic start and stop dates in addition to other pertinent events   11/21 PEA arrest, intubated, on epi gtt, PCCM admit  Interim History / Subjective:  Remains unresponsive on full mechanical ventilatory support Also requiring multiple vasopressor support to maintain map goal 65   Objective   Blood pressure (!) 127/91, pulse 94, temperature 98.6 F (37 C), temperature source Bladder, resp. rate (!) 32, height '5\' 9"'$  (1.753 m), weight 82.8 kg, SpO2 95 %. CVP:  [3 mmHg-58 mmHg] 7 mmHg  Vent Mode: PRVC FiO2 (%):  [60 %-100 %] 80 % Set Rate:  [18 bmp-32 bmp]  32 bmp Vt Set:  [560 mL] 560 mL PEEP:  [5 cKDT26-71cmH20] 12 cmH20 Plateau Pressure:  [24 cmH20-29 cmH20] 29 cmH20   Intake/Output Summary (Last 24 hours) at 03/07/2022 0855 Last data filed at 03/07/2022 0800 Gross per 24 hour  Intake 3766.63 ml  Output 1140 ml  Net 2626.63 ml   Filed Weights   03/07/2022 1851 03/07/22 0424  Weight: 99.8 kg 82.8 kg    General:  critically ill-appearing M, intubated and unresponsive HEENT: MM pink/moist, sclera anicteric, pupils unresponsive bilaterally, superficial abrasions to face, Neuro: no sedation and unresponsive to pain, pupils fixed and dilated bilaterally, no doll's eye and no gag CV: s1s2 rrr, no m/r/g PULM:  mechanically ventilated without significant rhonchi or wheezing  GI: soft, mildly distended Extremities: warm/dry, no edema  Skin: Superficial abrasions noted to the face  Resolved Hospital Problem list     Assessment & Plan:  Out of hospital PEA cardiac arrest, likely in the setting of electrolyte abnormalities Hypokalemia/hypocalcemia Post arrest anoxic encephalopathy Acute hypoxic/hypercapnic respiratory failure Septic shock with ARDS due to aspiration pneumonia Type II Dens fracture Recent admit for bee sting DM2 with hyperglycemia likely in the setting of steroid use Acute kidney injury due to ischemic ATN in the setting of cardiac arrest Shock liver Acute metabolic acidosis/lactic acidosis  15 to 20 minutes of CPR before ROSC achieved EKG showed nonischemic changes Likely in the setting of electrolyte abnormalities and metabolic derangement as patient blood sugar was 700 upon presentation and he was hypokalemic Initially required pacing  likely due to acidosis, converted to sinus rhythm once bicarbonate was given Continue normothermia protocol for 48 hours before slow rewarming Continue aggressive electrolyte supplement Continue telemetry monitoring CT head is showing loss of gray-white differentiation, early signs  of anoxic brain injury He remained off sedation, his neurological exam is poor Will obtain MRI brain tomorrow Continue lung protective ventilation Vent settings adjusted to clear hypercapnia VAP bundle Continue IV vasopressor support with map goal 65, currently on epinephrine, norepinephrine and vasopressin Started on stress dose steroid Continue IV antibiotics Follow-up cultures Appreciate neurosurgery consultation, recommend hard neck collar and watchful waiting Continue insulin infusion for now, blood sugars are better controlled He presented with blood sugar of 700 Monitor intake and output, avoid nephrotoxic agents Continue IV fluid Trend LFTs Trend lactate   Best Practice (right click and "Reselect all SmartList Selections" daily)   Diet/type: NPO DVT prophylaxis: prophylactic heparin  GI prophylaxis: PPI Lines: Central line Foley:  Yes, and it is still needed Code Status:  full code Last date of multidisciplinary goals of care discussion [11/22: Updated patient's wife at bedside, decided to continue full scope of care while keeping him full code  Labs   CBC: Recent Labs  Lab 03/02/2022 1856 02/23/2022 1857 02/27/2022 2155 03/05/2022 2159 03/07/22 0203 03/07/22 0214 03/07/22 0328 03/07/22 0538  WBC 17.4*  --  5.4  --   --  4.5  --   --   NEUTROABS 10.0*  --   --   --   --   --   --   --   HGB 12.3*   < > 14.7 14.6 15.6 13.6 13.3 13.9  HCT 38.8*   < > 43.6 43.0 46.0 38.2* 39.0 41.0  MCV 95.8  --  89.5  --   --  84.5  --   --   PLT 178  --  233  --   --  205  --   --    < > = values in this interval not displayed.    Basic Metabolic Panel: Recent Labs  Lab 02/20/2022 1856 02/27/2022 1857 03/01/2022 2036 02/27/2022 2159 03/07/22 0203 03/07/22 0214 03/07/22 0328 03/07/22 0538  NA 138 135   < > 134* 143 149* 147* 146*  K 4.1 4.4   < > 4.8 3.0* 2.7* 2.7* 3.0*  CL 100 102  --   --   --  111  --   --   CO2 16*  --   --   --   --  29  --   --   GLUCOSE 751* >700*  --    --   --  253*  --   --   BUN 18 19  --   --   --  20  --   --   CREATININE 1.80* 1.30*  --   --   --  1.72*  --   --   CALCIUM 11.4*  --   --   --   --  8.0*  --   --   MG  --   --   --   --   --  1.6*  --   --   PHOS  --   --   --   --   --  4.1  --   --    < > = values in this interval not displayed.   GFR: Estimated Creatinine Clearance: 36.5 mL/min (A) (by C-G formula based on SCr of 1.72 mg/dL (  H)). Recent Labs  Lab 02/18/2022 1856 03/05/2022 2115 02/16/2022 2155 03/07/22 0214 03/07/22 0224  WBC 17.4*  --  5.4 4.5  --   LATICACIDVEN >9.0* 7.5*  --   --  4.7*    Liver Function Tests: Recent Labs  Lab 02/20/2022 1856 03/07/22 0214  AST 305* 204*  ALT 268* 226*  ALKPHOS 145* 93  BILITOT 0.7 0.5  PROT 5.1* 4.5*  ALBUMIN 2.8* 2.4*   No results for input(s): "LIPASE", "AMYLASE" in the last 168 hours. No results for input(s): "AMMONIA" in the last 168 hours.  ABG    Component Value Date/Time   PHART 7.359 03/07/2022 0538   PCO2ART 39.5 03/07/2022 0538   PO2ART 101 03/07/2022 0538   HCO3 22.3 03/07/2022 0538   TCO2 23 03/07/2022 0538   ACIDBASEDEF 3.0 (H) 03/07/2022 0538   O2SAT 98 03/07/2022 0538     Coagulation Profile: No results for input(s): "INR", "PROTIME" in the last 168 hours.  Cardiac Enzymes: No results for input(s): "CKTOTAL", "CKMB", "CKMBINDEX", "TROPONINI" in the last 168 hours.  HbA1C: Hgb A1c MFr Bld  Date/Time Value Ref Range Status  06/08/2019 03:05 PM 8.8 (H) 4.8 - 5.6 % Final    Comment:    (NOTE) Pre diabetes:          5.7%-6.4% Diabetes:              >6.4% Glycemic control for   <7.0% adults with diabetes     CBG: Recent Labs  Lab 03/07/22 0155 03/07/22 0310 03/07/22 0414 03/07/22 0535 03/07/22 0802  GLUCAP 258* 154* 170* 154* 131*    Total critical care time: 48 minutes  Performed by: Scissors care time was exclusive of separately billable procedures and treating other patients.   Critical care was  necessary to treat or prevent imminent or life-threatening deterioration.   Critical care was time spent personally by me on the following activities: development of treatment plan with patient and/or surrogate as well as nursing, discussions with consultants, evaluation of patient's response to treatment, examination of patient, obtaining history from patient or surrogate, ordering and performing treatments and interventions, ordering and review of laboratory studies, ordering and review of radiographic studies, pulse oximetry and re-evaluation of patient's condition.   Jacky Kindle, MD [facility] See Amion for pager If no response to pager, please call [phone] until 7pm After 7pm, Please call E-link [phone]

## 2022-03-07 NOTE — Procedures (Signed)
Patient Name: Lance Crosby  MRN: [id number]  Epilepsy Attending: Lora Havens  Referring Physician/Provider: Candee Furbish, MD  Date: 02/27/2022  Duration: 22.39 mins  Patient history: 76 year old male status post cardiac arrest.  EEG to evaluate for seizure.  Level of alertness:  comatose  AEDs during EEG study: None  Technical aspects: This EEG study was done with scalp electrodes positioned according to the 10-20 International system of electrode placement. Electrical activity was reviewed with band pass filter of 1-'70Hz'$ , sensitivity of 7 uV/mm, display speed of 67m/sec with a '60Hz'$  notched filter applied as appropriate. EEG data were recorded continuously and digitally stored.  Video monitoring was available and reviewed as appropriate.  Description: EEG showed continuous generalized pattern suppression.  EEG was not reactive to tactile stimulation.  Hyperventilation and photic stimulation were not performed.     ABNORMALITY - Background suppression generalized  IMPRESSION: This study is suggestive of profound diffuse encephalopathy, nonspecific etiology. No seizures or epileptiform discharges were seen throughout the recording.  [name] [name]

## 2022-03-07 NOTE — Progress Notes (Signed)
[place] Progress Note Patient Name: Lance Crosby DOB: 05-Apr-1946 MRN: [id number]   Date of Service  03/07/2022  HPI/Events of Note  Lactic Acid = 7.5 --> 4.7. Lactic Acid is slowly clearing. 2. Hypokalemia  Hypomagnesemia - K+ = 2.7 which is already being replaced. Mg++ = 1.6 and Creatinine = 1.72.   eICU Interventions  Plan: Will replace Mg++. Continue to trend Lactic Acid.     Intervention Category Major Interventions: Electrolyte abnormality - evaluation and management;Acid-Base disturbance - evaluation and management  Lance Crosby 03/07/2022, 3:35 AM

## 2022-03-07 NOTE — Progress Notes (Addendum)
Worsening shock Refractory acidemia Unable to ventilate CXR, bicarb pushes, additional pressors, trial of PRN NMB as he does trigger vent intermittently which may be limiting somewhat Will update family on gravity of these changes  Erskine Emery MD PCCM   Addendum: CXR developing ARDS pattern.  Nothing to do specifically.  See IPAL note.

## 2022-03-07 NOTE — Progress Notes (Signed)
[place] Progress Note Patient Name: Lance Crosby DOB: 1945/08/17 MRN: [id number]   Date of Service  03/07/2022  HPI/Events of Note  ABG on 100%/PRVC 35/TV 560/P 12 = 7.331/45.3/119/24.1.  eICU Interventions  Continue present management.      Intervention Category Major Interventions: Respiratory failure - evaluation and management;Acid-Base disturbance - evaluation and management  Siria Calandro Eugene 03/07/2022, 4:23 AM

## 2022-03-07 NOTE — Progress Notes (Addendum)
[place] Progress Note Patient Name: Lance Crosby DOB: 10/24/1945 MRN: [id number]   Date of Service  03/07/2022  HPI/Events of Note  Remains hypotensive - BP = 81/50 with MAP = 59. ABG on 100%/PRVC 28/TV 560/P 5 = 7.162/49.2/116/17.7. PRVC rate increase limited by air trapping at a rate of 35.  eICU Interventions  Plan: NaHCO3 200 meq IV now. Increase NaHCO3 IV infusion to 125 mL/hour. Increase PRVC rate to 32. Repeat ABG at 3:30 AM. Add Vasopressin IV infusion at shock dose.  Will request PCCM ground team evaluate the patient at bedside.  Please send AM labs now.      Intervention Category Major Interventions: Respiratory failure - evaluation and management;Acid-Base disturbance - evaluation and management  Lance Crosby 03/07/2022, 2:14 AM

## 2022-03-07 NOTE — Progress Notes (Deleted)
[place] referral [id number]. Patient is currently not considered a potential donor unless formal brain death testing is done. In the event of cardiac death, call HB with TOD.

## 2022-03-07 NOTE — Progress Notes (Signed)
Initial Nutrition Assessment  DOCUMENTATION CODES:   Not applicable  INTERVENTION:  -Monitor clinical course and provide nutrition as clinically appropriate  NUTRITION DIAGNOSIS:  Inadequate oral intake related to other (see comment) (intubation, on 3 pressors) as evidenced by NPO status.  GOAL:  Patient will meet greater than or equal to 90% of their needs  MONITOR:  Other (Comment) (initiation of nutrition)  REASON FOR ASSESSMENT:  Ventilator    ASSESSMENT:  Pt is a 76yo M with PMH of DM, HTN, colon cancer, arthritis and GERD who presents with cardiac arrest and cervical fracture from fall.  Pt remains on full ventilator support with post arrest anoxic encephalopathy. EEG showing "profound diffuse encephalopathy." Neuro recommended treatment of cervical fracture with c collar for immobilization. Rewarming initiated, currently 37C.  Pt on 3 pressors to maintain MAP >65. Has elevated lactic acid. Pt is not appropriate for nutrition support at this time. POC to continue full scope of care while keeping him full code.   Medications reviewed and include: pepcid, solu-cortef, protonix, miralax, insulin drip, magnesium sulfate, levophed, KCl, sodium bicarb, vasopressin, epinephrine  Labs reviewed: K:3.1, BG:138, Cr:1.64, GFR:43, lactic acid:5.3    NUTRITION - FOCUSED PHYSICAL EXAM: Not appropriate at this time.  Diet Order:   Diet Order             Diet NPO time specified  Diet effective now                   EDUCATION NEEDS:   Not appropriate for education at this time  Skin:  Skin Assessment: Skin Integrity Issues: Skin Integrity Issues:: Other (Comment) Other: laceration to face and nose  Last BM:  11/21  Height:  Ht Readings from Last 1 Encounters:  02/23/2022 '5\' 9"'$  (1.753 m)   Weight:  Wt Readings from Last 1 Encounters:  03/07/22 82.8 kg    BMI:  Body mass index is 26.96 kg/m.  Estimated Nutritional Needs:  Kcal:  1700-2100kcal Protein:   100-125g Fluid:  1900-2155m  KCandise Bowens MS, RD, LDN, CNSC See AMiON for contact information

## 2022-03-07 NOTE — IPAL (Signed)
  Interdisciplinary Goals of Care Family Meeting   Date carried out: 03/07/2022  Location of the meeting: Bedside  Member's involved: Physician, Bedside Registered Nurse, and Family Member or next of kin  Durable Power of Attorney or acting medical decision maker: Wife, to come to hospital soon    Discussion: We discussed goals of care for Lance Crosby .    Discussed refractory nature of shock and acidemia c/w post cardiac arrest inflammatory syndrome which is often fatal.  Discussed what patient would have wanted given extremely low likelihood of ROSC should recurrent arrest occur and possibility of prolonging suffering.    Daughter needs time.  Calling in family, relaxing visitiation and will see how next couple hours go.  Relayed I am worried he is in terminal decline should things continue on present trajectory.  Code status: Full Code  Disposition: Continue current acute care  Time spent for the meeting: 8 mins    Candee Furbish, MD  03/07/2022, 2:39 AM

## 2022-03-07 NOTE — Progress Notes (Addendum)
Inpatient Diabetes Program Recommendations  AACE/ADA: New Consensus Statement on Inpatient Glycemic Control (2015)  Target Ranges:  Prepandial:   less than 140 mg/dL      Peak postprandial:   less than 180 mg/dL (1-2 hours)      Critically ill patients:  140 - 180 mg/dL   Lab Results  Component Value Date   GLUCAP 150 (H) 03/07/2022   HGBA1C 8.8 (H) 06/08/2019    Review of Glycemic Control  Latest Reference Range & Units 03/07/22 04:14 03/07/22 05:35 03/07/22 08:02 03/07/22 09:22  Glucose-Capillary 70 - 99 mg/dL 170 (H) 154 (H) 131 (H) 150 (H)   Diabetes history: DM  Outpatient Diabetes medications: Rybelsus 14 mg daily, Janumet 50-1000 bid (Pred. Taper started 02/22/22) Current orders for Inpatient glycemic control:  IV insulin  Solucortef 100 mg bid  Inpatient Diabetes Program Recommendations:    Agree with IV insulin/ICU order set. Will follow.   Thanks   Adah Perl, RN, BC-ADM Inpatient Diabetes Coordinator Pager [phone]  (8a-5p)

## 2022-03-07 NOTE — Consult Note (Addendum)
CC: cardiac arrest, odontoid fracture  HPI:     Patient is a 76 y.o. male presents after syncopal event with cardiac arrest.  Family heard him falling to the floor.  CPR was given and patient was intubated and started on an epinephrine drip.  Imaging revealed a type II odontoid fracture with mild displacement though no compromise of the spinal cord structurally.    Patient Active Problem List   Diagnosis Date Noted   Cardiac arrest ([place]) 02/20/2022   Complete tear of left rotator cuff 07/13/2020   Cervicalgia 12/30/2019   Bilateral primary osteoarthritis of knee 04/22/2019   Pain in left shoulder 04/22/2019   FLATULENCE-GAS-BLOATING 11/14/2009   PERSONAL HX COLON CANCER 11/14/2009   Past Medical History:  Diagnosis Date   Arthritis    Claustrophobia    Colon cancer ([place]) 2000   colon cancer   Diabetes mellitus without complication ([place])    GERD (gastroesophageal reflux disease)    if eats late    Hypertension     Past Surgical History:  Procedure Laterality Date   COLON SURGERY  02/1999   COLONOSCOPY     FINGER SURGERY     thumb surgery- September 2016   KNEE SURGERY     POLYPECTOMY     ROTATOR CUFF REPAIR      Medications Prior to Admission  Medication Sig Dispense Refill Last Dose   acetaminophen (TYLENOL) 500 MG tablet Take 500-1,000 mg by mouth daily as needed for mild pain.      amLODipine (NORVASC) 10 MG tablet Take 10 mg by mouth daily.      atenolol (TENORMIN) 50 MG tablet Take 50 mg by mouth every evening.       atorvastatin (LIPITOR) 80 MG tablet Take 80 mg by mouth every evening.       cetirizine (ZYRTEC ALLERGY) 10 MG tablet Take 1 tablet (10 mg total) by mouth daily for 14 days. 14 tablet 0    clotrimazole (LOTRIMIN) 1 % cream Apply to affected area 2 times daily 15 g 0    diphenhydrAMINE (BENADRYL) 25 MG tablet Take 1 tablet (25 mg total) by mouth every 6 (six) hours. X 3 days then PRN itching, allergic reaction 20 tablet 0    EPINEPHrine 0.3 mg/0.3 mL  IJ SOAJ injection Inject 0.3 mg into the muscle once as needed (severe allergic reaction). 2 each 1    ergocalciferol (VITAMIN D2) 1.25 MG (50000 UT) capsule Take 50,000 Units by mouth once a week.      famotidine (PEPCID) 20 MG tablet Take 1 tablet (20 mg total) by mouth 2 (two) times daily. X 3 days than PRN allergic reaction 20 tablet 0    hydrochlorothiazide (HYDRODIURIL) 25 MG tablet Take 25 mg by mouth daily.      HYDROcodone-acetaminophen (NORCO/VICODIN) 5-325 MG tablet Take 1 tablet by mouth every 8 (eight) hours as needed for moderate pain. 30 tablet 0    losartan (COZAAR) 100 MG tablet Take 50-100 mg by mouth See admin instructions. Pt suppose to be taking '100mg'$  qd as of 09/18/19 but pt is finishing bottle of '50mg'$  QD explained to pt the dosage was different states he will finish this bottle then get the '100mg'$       oxyCODONE-acetaminophen (PERCOCET/ROXICET) 5-325 MG tablet Take 1-2 tablets by mouth every 4 (four) hours as needed for severe pain. 40 tablet 0    predniSONE (STERAPRED UNI-PAK 21 TAB) 10 MG (21) TBPK tablet Take by mouth daily. Take 6 tabs by  mouth daily  for 2 days, then 5 tabs for 2 days, then 4 tabs for 2 days, then 3 tabs for 2 days, 2 tabs for 2 days, then 1 tab by mouth daily for 2 days 42 tablet 0    RYBELSUS 14 MG TABS Take 14 mg by mouth daily.       sitaGLIPtin-metformin (JANUMET) 50-1000 MG tablet Take 1 tablet by mouth 2 (two) times daily with a meal.      Allergies  Allergen Reactions   Other Anaphylaxis    Pt states "steroids caused increase in blood sugar."     Social History   Tobacco Use   Smoking status: Former    Packs/day: 1.00    Years: 5.00    Total pack years: 5.00    Types: Cigarettes    Quit date: 06/07/1973    Years since quitting: 48.7   Smokeless tobacco: Never  Substance Use Topics   Alcohol use: No    Family History  Problem Relation Age of Onset   Colon cancer Neg Hx    Esophageal cancer Neg Hx    Rectal cancer Neg Hx    Stomach  cancer Neg Hx    Colon polyps Neg Hx      Review of Systems Review of systems not obtained due to patient factors.  Objective:   Patient Vitals for the past 8 hrs:  BP Temp Temp src Pulse Resp SpO2 Weight  03/07/22 0845 -- -- -- 94 (!) 32 95 % --  03/07/22 0830 -- -- -- 95 (!) 32 96 % --  03/07/22 0815 -- -- -- 78 (!) 29 97 % --  03/07/22 0811 (!) 127/91 -- -- 92 (!) 32 97 % --  03/07/22 0800 (!) 127/91 98.6 F (37 C) Bladder 82 (!) 32 97 % --  03/07/22 0715 -- -- -- 83 (!) 58 96 % --  03/07/22 0700 119/87 98.8 F (37.1 C) Bladder 83 (!) 57 96 % --  03/07/22 0645 -- -- -- 83 (!) 32 96 % --  03/07/22 0630 -- -- -- 90 (!) 32 96 % --  03/07/22 0615 -- -- -- 65 (!) 32 97 % --  03/07/22 0600 115/88 98.6 F (37 C) -- 75 (!) 32 95 % --  03/07/22 0545 -- -- -- 72 (!) 32 95 % --  03/07/22 0530 -- -- -- 80 (!) 32 95 % --  03/07/22 0515 -- -- -- 81 (!) 32 97 % --  03/07/22 0500 108/85 98.6 F (37 C) -- 88 (!) 32 94 % --  03/07/22 0445 -- -- -- 87 (!) 32 94 % --  03/07/22 0430 -- 98.4 F (36.9 C) -- 88 (!) 32 95 % --  03/07/22 0424 -- -- -- -- -- -- 82.8 kg  03/07/22 0415 -- -- -- 84 (!) 32 96 % --  03/07/22 0400 102/82 97.9 F (36.6 C) Bladder 85 (!) 32 95 % --  03/07/22 0345 -- -- -- 86 (!) 32 94 % --  03/07/22 0330 -- (!) 97.2 F (36.2 C) -- 99 (!) 32 95 % --  03/07/22 0315 -- -- -- 96 (!) 32 97 % --  03/07/22 0300 115/75 (!) 96.6 F (35.9 C) -- 63 (!) 32 97 % --  03/07/22 0245 -- -- -- 89 (!) 32 96 % --  03/07/22 0230 -- (!) 96.6 F (35.9 C) -- (!) 50 (!) 32 94 % --  03/07/22 0215 -- -- -- (!) 50 Marland Kitchen)  29 97 % --  03/07/22 0200 (!) 87/71 (!) 97 F (36.1 C) -- -- (!) 28 -- --  03/07/22 0159 (!) 83/62 -- -- 68 (!) 28 94 % --  03/07/22 0145 (!) 83/62 -- -- 70 (!) 29 (!) 89 % --  03/07/22 0130 99/71 -- -- 74 (!) 28 94 % --  03/07/22 0120 -- 97.7 F (36.5 C) -- 67 (!) 28 98 % --  03/07/22 0115 (!) 87/58 (!) 97.5 F (36.4 C) -- 76 19 (!) 88 % --   I/O last 3 completed  shifts: In: 3544.9 [I.V.:1445; IV Piggyback:2099.9] Out: 1115 [Urine:915; Emesis/NG output:200] Total I/O In: 221.7 [I.V.:208.3; IV Piggyback:13.5] Out: 25 [Urine:25]    Intubated Eyes closed Pupils fixed and nonreactive, 5 mm No corneal or gag reflex No spontaneous respirations over vent observed  Data Review  CT head without contrast was reviewed and suggestive of anoxic brain injury with poor differentiation of gray-white matter CT cervical spine without contrast was reviewed.  There is a oblique type II odontoid fracture, with minimal to mild displacement.  No significant canal stenosis is noted.  Assessment:   Principal Problem:   Cardiac arrest [facility]) This is a 76 year old man who suffered cardiac arrest and fall with resultant type II odontoid fracture.  He has severe anoxic brain injury.  Plan:   -For the cervical fracture, I am recommending c-collar immobilization.  It appears that his neurologic prognosis is dismal given his poor exam -You can call us for questions but I do not foresee any operative intervention in his case. -I discussed my clinical findings and recommendations with the patient's wife who is at the bedside.

## 2022-03-08 ENCOUNTER — Inpatient Hospital Stay (HOSPITAL_COMMUNITY): Payer: Medicare PPO

## 2022-03-08 DIAGNOSIS — R6521 Severe sepsis with septic shock: Secondary | ICD-10-CM

## 2022-03-08 DIAGNOSIS — A419 Sepsis, unspecified organism: Secondary | ICD-10-CM

## 2022-03-08 LAB — COMPREHENSIVE METABOLIC PANEL
ALT: 148 U/L — ABNORMAL HIGH (ref 0–44)
AST: 86 U/L — ABNORMAL HIGH (ref 15–41)
Albumin: 2.4 g/dL — ABNORMAL LOW (ref 3.5–5.0)
Alkaline Phosphatase: 64 U/L (ref 38–126)
Anion gap: 18 — ABNORMAL HIGH (ref 5–15)
BUN: 13 mg/dL (ref 8–23)
CO2: 25 mmol/L (ref 22–32)
Calcium: 8.4 mg/dL — ABNORMAL LOW (ref 8.9–10.3)
Chloride: 98 mmol/L (ref 98–111)
Creatinine, Ser: 1.2 mg/dL (ref 0.61–1.24)
GFR, Estimated: >60 mL/min (ref >60)
Glucose, Bld: 150 mg/dL — ABNORMAL HIGH (ref 70–99)
Potassium: 3.2 mmol/L — ABNORMAL LOW (ref 3.5–5.1)
Sodium: 141 mmol/L (ref 135–145)
Total Bilirubin: 1.3 mg/dL — ABNORMAL HIGH (ref 0.3–1.2)
Total Protein: 5.2 g/dL — ABNORMAL LOW (ref 6.5–8.1)

## 2022-03-08 LAB — POCT I-STAT 7, (LYTES, BLD GAS, ICA,H+H)
Acid-Base Excess: 5 mmol/L — ABNORMAL HIGH (ref 0.0–2.0)
Acid-Base Excess: 1 mmol/L (ref 0.0–2.0)
Acid-base deficit: 1 mmol/L (ref 0.0–2.0)
Bicarbonate: 28.4 mmol/L — ABNORMAL HIGH (ref 20.0–28.0)
Bicarbonate: 25.8 mmol/L (ref 20.0–28.0)
Bicarbonate: 28.1 mmol/L — ABNORMAL HIGH (ref 20.0–28.0)
Calcium, Ion: 1.09 mmol/L — ABNORMAL LOW (ref 1.15–1.40)
Calcium, Ion: 1.09 mmol/L — ABNORMAL LOW (ref 1.15–1.40)
Calcium, Ion: 1.13 mmol/L — ABNORMAL LOW (ref 1.15–1.40)
HCT: 39 % (ref 39.0–52.0)
HCT: 37 % — ABNORMAL LOW (ref 39.0–52.0)
HCT: 40 % (ref 39.0–52.0)
Hemoglobin: 13.3 g/dL (ref 13.0–17.0)
Hemoglobin: 12.6 g/dL — ABNORMAL LOW (ref 13.0–17.0)
Hemoglobin: 13.6 g/dL (ref 13.0–17.0)
O2 Saturation: 96 %
O2 Saturation: 99 %
O2 Saturation: 96 %
Patient temperature: 36.9
Patient temperature: 36.5
Patient temperature: 36.4
Potassium: 3.2 mmol/L — ABNORMAL LOW (ref 3.5–5.1)
Potassium: 3.8 mmol/L (ref 3.5–5.1)
Potassium: 3.5 mmol/L (ref 3.5–5.1)
Sodium: 140 mmol/L (ref 135–145)
Sodium: 139 mmol/L (ref 135–145)
Sodium: 141 mmol/L (ref 135–145)
TCO2: 30 mmol/L (ref 22–32)
TCO2: 27 mmol/L (ref 22–32)
TCO2: 30 mmol/L (ref 22–32)
pCO2 arterial: 38.3 mmHg (ref 32–48)
pCO2 arterial: 38.6 mmHg (ref 32–48)
pCO2 arterial: 67 mmHg (ref 32–48)
pH, Arterial: 7.478 — ABNORMAL HIGH (ref 7.35–7.45)
pH, Arterial: 7.431 (ref 7.35–7.45)
pH, Arterial: 7.228 — ABNORMAL LOW (ref 7.35–7.45)
pO2, Arterial: 79 mmHg — ABNORMAL LOW (ref 83–108)
pO2, Arterial: 143 mmHg — ABNORMAL HIGH (ref 83–108)
pO2, Arterial: 94 mmHg (ref 83–108)

## 2022-03-08 LAB — URINE DRUGS OF ABUSE SCREEN W ALC, ROUTINE (REF LAB)
Amphetamines, Urine: NEGATIVE ng/mL
Barbiturate, Ur: NEGATIVE ng/mL
Benzodiazepine Quant, Ur: NEGATIVE ng/mL
Cannabinoid Quant, Ur: NEGATIVE ng/mL
Cocaine (Metab.): NEGATIVE ng/mL
Ethanol U, Quan: NEGATIVE %
Methadone Screen, Urine: NEGATIVE ng/mL
Opiate Quant, Ur: NEGATIVE ng/mL
Phencyclidine, Ur: NEGATIVE ng/mL
Propoxyphene, Urine: NEGATIVE ng/mL

## 2022-03-08 LAB — PHOSPHORUS: Phosphorus: 3.8 mg/dL (ref 2.5–4.6)

## 2022-03-08 LAB — CBC
HCT: 40.2 % (ref 39.0–52.0)
Hemoglobin: 14.3 g/dL (ref 13.0–17.0)
MCH: 29.9 pg (ref 26.0–34.0)
MCHC: 35.6 g/dL (ref 30.0–36.0)
MCV: 83.9 fL (ref 80.0–100.0)
Platelets: 115 10*3/uL — ABNORMAL LOW (ref 150–400)
RBC: 4.79 MIL/uL (ref 4.22–5.81)
RDW: 14.6 % (ref 11.5–15.5)
WBC: 13.9 10*3/uL — ABNORMAL HIGH (ref 4.0–10.5)
nRBC: 0 % (ref 0.0–0.2)

## 2022-03-08 LAB — GLUCOSE, CAPILLARY
Glucose-Capillary: 141 mg/dL — ABNORMAL HIGH (ref 70–99)
Glucose-Capillary: 166 mg/dL — ABNORMAL HIGH (ref 70–99)
Glucose-Capillary: 127 mg/dL — ABNORMAL HIGH (ref 70–99)
Glucose-Capillary: 239 mg/dL — ABNORMAL HIGH (ref 70–99)

## 2022-03-08 LAB — MAGNESIUM: Magnesium: 1.8 mg/dL (ref 1.7–2.4)

## 2022-03-08 MED ORDER — AMIODARONE HCL IN DEXTROSE 360-4.14 MG/200ML-% IV SOLN
60.0000 mg/h | INTRAVENOUS | Status: AC
  Administered 2022-03-08: 60 mg/h via INTRAVENOUS

## 2022-03-08 MED ORDER — AMIODARONE IV BOLUS ONLY 150 MG/100ML: 150.0000 mg | Freq: Once | INTRAVENOUS | Status: AC

## 2022-03-08 MED ORDER — AMIODARONE IV BOLUS ONLY 150 MG/100ML
INTRAVENOUS | Status: AC
  Administered 2022-03-08: 150 mg via INTRAVENOUS
  Filled 2022-03-08: qty 100

## 2022-03-08 MED ORDER — AMIODARONE HCL IN DEXTROSE 360-4.14 MG/200ML-% IV SOLN
30.0000 mg/h | INTRAVENOUS | Status: DC
  Administered 2022-03-08 – 2022-03-10 (×4): 30 mg/h via INTRAVENOUS
  Filled 2022-03-08 (×4): qty 200

## 2022-03-08 MED ORDER — INSULIN ASPART 100 UNIT/ML IJ SOLN: 0.0000 [IU] | INTRAMUSCULAR | Status: DC

## 2022-03-08 MED ORDER — POTASSIUM CHLORIDE 10 MEQ/50ML IV SOLN
10.0000 meq | INTRAVENOUS | Status: AC
  Administered 2022-03-08 (×4): 10 meq via INTRAVENOUS
  Filled 2022-03-08 (×4): qty 50

## 2022-03-08 MED ORDER — INSULIN DETEMIR 100 UNIT/ML ~~LOC~~ SOLN
10.0000 [IU] | Freq: Two times a day (BID) | SUBCUTANEOUS | Status: DC
  Administered 2022-03-08: 10 [IU] via SUBCUTANEOUS
  Filled 2022-03-08 (×2): qty 0.1

## 2022-03-08 MED ORDER — POTASSIUM CHLORIDE 20 MEQ PO PACK
20.0000 meq | PACK | ORAL | Status: DC
  Administered 2022-03-08: 20 meq
  Filled 2022-03-08 (×2): qty 1

## 2022-03-08 MED ORDER — POTASSIUM CHLORIDE 20 MEQ PO PACK
40.0000 meq | PACK | Freq: Once | ORAL | Status: AC
  Administered 2022-03-08: 40 meq
  Filled 2022-03-08: qty 2

## 2022-03-08 MED ORDER — AMIODARONE HCL IN DEXTROSE 360-4.14 MG/200ML-% IV SOLN
INTRAVENOUS | Status: AC
  Filled 2022-03-08: qty 200

## 2022-03-09 ENCOUNTER — Inpatient Hospital Stay (HOSPITAL_COMMUNITY): Payer: Medicare PPO

## 2022-03-09 LAB — SARS CORONAVIRUS 2 BY RT PCR: SARS Coronavirus 2 by RT PCR: NEGATIVE

## 2022-03-09 LAB — COMPREHENSIVE METABOLIC PANEL
ALT: 79 U/L — ABNORMAL HIGH (ref 0–44)
ALT: 70 U/L — ABNORMAL HIGH (ref 0–44)
ALT: 63 U/L — ABNORMAL HIGH (ref 0–44)
AST: 32 U/L (ref 15–41)
AST: 34 U/L (ref 15–41)
AST: 32 U/L (ref 15–41)
Albumin: 2 g/dL — ABNORMAL LOW (ref 3.5–5.0)
Albumin: 2 g/dL — ABNORMAL LOW (ref 3.5–5.0)
Albumin: 2.6 g/dL — ABNORMAL LOW (ref 3.5–5.0)
Alkaline Phosphatase: 95 U/L (ref 38–126)
Alkaline Phosphatase: 92 U/L (ref 38–126)
Alkaline Phosphatase: 91 U/L (ref 38–126)
Anion gap: 18 — ABNORMAL HIGH (ref 5–15)
Anion gap: 15 (ref 5–15)
Anion gap: 18 — ABNORMAL HIGH (ref 5–15)
BUN: 32 mg/dL — ABNORMAL HIGH (ref 8–23)
BUN: 32 mg/dL — ABNORMAL HIGH (ref 8–23)
BUN: 32 mg/dL — ABNORMAL HIGH (ref 8–23)
CO2: 20 mmol/L — ABNORMAL LOW (ref 22–32)
CO2: 21 mmol/L — ABNORMAL LOW (ref 22–32)
CO2: 22 mmol/L (ref 22–32)
Calcium: 7.7 mg/dL — ABNORMAL LOW (ref 8.9–10.3)
Calcium: 8.2 mg/dL — ABNORMAL LOW (ref 8.9–10.3)
Calcium: 9.2 mg/dL (ref 8.9–10.3)
Chloride: 104 mmol/L (ref 98–111)
Chloride: 105 mmol/L (ref 98–111)
Chloride: 105 mmol/L (ref 98–111)
Creatinine, Ser: 2.83 mg/dL — ABNORMAL HIGH (ref 0.61–1.24)
Creatinine, Ser: 3.13 mg/dL — ABNORMAL HIGH (ref 0.61–1.24)
Creatinine, Ser: 2.83 mg/dL — ABNORMAL HIGH (ref 0.61–1.24)
GFR, Estimated: 22 mL/min — ABNORMAL LOW (ref >60)
GFR, Estimated: 20 mL/min — ABNORMAL LOW (ref >60)
GFR, Estimated: 22 mL/min — ABNORMAL LOW (ref >60)
Glucose, Bld: 394 mg/dL — ABNORMAL HIGH (ref 70–99)
Glucose, Bld: 343 mg/dL — ABNORMAL HIGH (ref 70–99)
Glucose, Bld: 260 mg/dL — ABNORMAL HIGH (ref 70–99)
Potassium: 4.1 mmol/L (ref 3.5–5.1)
Potassium: 3.3 mmol/L — ABNORMAL LOW (ref 3.5–5.1)
Potassium: 3.2 mmol/L — ABNORMAL LOW (ref 3.5–5.1)
Sodium: 142 mmol/L (ref 135–145)
Sodium: 141 mmol/L (ref 135–145)
Sodium: 145 mmol/L (ref 135–145)
Total Bilirubin: 1.3 mg/dL — ABNORMAL HIGH (ref 0.3–1.2)
Total Bilirubin: 1.4 mg/dL — ABNORMAL HIGH (ref 0.3–1.2)
Total Bilirubin: 1.9 mg/dL — ABNORMAL HIGH (ref 0.3–1.2)
Total Protein: 5.1 g/dL — ABNORMAL LOW (ref 6.5–8.1)
Total Protein: 4.9 g/dL — ABNORMAL LOW (ref 6.5–8.1)
Total Protein: 5.3 g/dL — ABNORMAL LOW (ref 6.5–8.1)

## 2022-03-09 LAB — GLUCOSE, CAPILLARY
Glucose-Capillary: 362 mg/dL — ABNORMAL HIGH (ref 70–99)
Glucose-Capillary: 321 mg/dL — ABNORMAL HIGH (ref 70–99)
Glucose-Capillary: 383 mg/dL — ABNORMAL HIGH (ref 70–99)
Glucose-Capillary: 382 mg/dL — ABNORMAL HIGH (ref 70–99)
Glucose-Capillary: 352 mg/dL — ABNORMAL HIGH (ref 70–99)
Glucose-Capillary: 310 mg/dL — ABNORMAL HIGH (ref 70–99)
Glucose-Capillary: 276 mg/dL — ABNORMAL HIGH (ref 70–99)
Glucose-Capillary: 251 mg/dL — ABNORMAL HIGH (ref 70–99)
Glucose-Capillary: 205 mg/dL — ABNORMAL HIGH (ref 70–99)
Glucose-Capillary: 197 mg/dL — ABNORMAL HIGH (ref 70–99)

## 2022-03-09 LAB — TYPE AND SCREEN
ABO/RH(D): O POS
Antibody Screen: NEGATIVE

## 2022-03-09 LAB — URINALYSIS, ROUTINE W REFLEX MICROSCOPIC
Bilirubin Urine: NEGATIVE
Glucose, UA: 50 mg/dL — AB
Ketones, ur: NEGATIVE mg/dL
Leukocytes,Ua: NEGATIVE
Nitrite: NEGATIVE
Protein, ur: 30 mg/dL — AB
Specific Gravity, Urine: 1.025 (ref 1.005–1.030)
pH: 5 (ref 5.0–8.0)

## 2022-03-09 LAB — CBC
HCT: 31.7 % — ABNORMAL LOW (ref 39.0–52.0)
Hemoglobin: 11.2 g/dL — ABNORMAL LOW (ref 13.0–17.0)
MCH: 30.8 pg (ref 26.0–34.0)
MCHC: 35.3 g/dL (ref 30.0–36.0)
MCV: 87.1 fL (ref 80.0–100.0)
Platelets: 69 10*3/uL — ABNORMAL LOW (ref 150–400)
RBC: 3.64 MIL/uL — ABNORMAL LOW (ref 4.22–5.81)
RDW: 15.7 % — ABNORMAL HIGH (ref 11.5–15.5)
WBC: 10.1 10*3/uL (ref 4.0–10.5)
nRBC: 0 % (ref 0.0–0.2)

## 2022-03-09 LAB — CBC WITH DIFFERENTIAL/PLATELET
Abs Immature Granulocytes: 0 10*3/uL (ref 0.00–0.07)
Basophils Absolute: 0 10*3/uL (ref 0.0–0.1)
Basophils Relative: 0 %
Eosinophils Absolute: 0 10*3/uL (ref 0.0–0.5)
Eosinophils Relative: 0 %
HCT: 37.3 % — ABNORMAL LOW (ref 39.0–52.0)
Hemoglobin: 12.5 g/dL — ABNORMAL LOW (ref 13.0–17.0)
Lymphocytes Relative: 10 %
Lymphs Abs: 1.1 10*3/uL (ref 0.7–4.0)
MCH: 29.8 pg (ref 26.0–34.0)
MCHC: 33.5 g/dL (ref 30.0–36.0)
MCV: 89 fL (ref 80.0–100.0)
Monocytes Absolute: 0.2 10*3/uL (ref 0.1–1.0)
Monocytes Relative: 2 %
Neutro Abs: 9.7 10*3/uL — ABNORMAL HIGH (ref 1.7–7.7)
Neutrophils Relative %: 88 %
Platelets: 103 10*3/uL — ABNORMAL LOW (ref 150–400)
RBC: 4.19 MIL/uL — ABNORMAL LOW (ref 4.22–5.81)
RDW: 15.7 % — ABNORMAL HIGH (ref 11.5–15.5)
WBC: 11 10*3/uL — ABNORMAL HIGH (ref 4.0–10.5)
nRBC: 0 /100 WBC
nRBC: 0.2 % (ref 0.0–0.2)

## 2022-03-09 LAB — POCT I-STAT 7, (LYTES, BLD GAS, ICA,H+H)
Acid-base deficit: 3 mmol/L — ABNORMAL HIGH (ref 0.0–2.0)
Acid-base deficit: 1 mmol/L (ref 0.0–2.0)
Bicarbonate: 22 mmol/L (ref 20.0–28.0)
Bicarbonate: 25.1 mmol/L (ref 20.0–28.0)
Calcium, Ion: 1.07 mmol/L — ABNORMAL LOW (ref 1.15–1.40)
Calcium, Ion: 1.25 mmol/L (ref 1.15–1.40)
HCT: 35 % — ABNORMAL LOW (ref 39.0–52.0)
HCT: 40 % (ref 39.0–52.0)
Hemoglobin: 11.9 g/dL — ABNORMAL LOW (ref 13.0–17.0)
Hemoglobin: 13.6 g/dL (ref 13.0–17.0)
O2 Saturation: 99 %
O2 Saturation: 100 %
Patient temperature: 101.7
Potassium: 3.9 mmol/L (ref 3.5–5.1)
Potassium: 3.1 mmol/L — ABNORMAL LOW (ref 3.5–5.1)
Sodium: 138 mmol/L (ref 135–145)
Sodium: 142 mmol/L (ref 135–145)
TCO2: 23 mmol/L (ref 22–32)
TCO2: 26 mmol/L (ref 22–32)
pCO2 arterial: 41 mmHg (ref 32–48)
pCO2 arterial: 44.1 mmHg (ref 32–48)
pH, Arterial: 7.344 — ABNORMAL LOW (ref 7.35–7.45)
pH, Arterial: 7.363 (ref 7.35–7.45)
pO2, Arterial: 162 mmHg — ABNORMAL HIGH (ref 83–108)
pO2, Arterial: 327 mmHg — ABNORMAL HIGH (ref 83–108)

## 2022-03-09 LAB — HEMOGLOBIN A1C
Hgb A1c MFr Bld: 11.4 % — ABNORMAL HIGH (ref 4.8–5.6)
Mean Plasma Glucose: 280 mg/dL

## 2022-03-09 LAB — PROTIME-INR
INR: 1.3 — ABNORMAL HIGH (ref 0.8–1.2)
INR: 1.4 — ABNORMAL HIGH (ref 0.8–1.2)
Prothrombin Time: 15.7 seconds — ABNORMAL HIGH (ref 11.4–15.2)
Prothrombin Time: 16.8 seconds — ABNORMAL HIGH (ref 11.4–15.2)

## 2022-03-09 LAB — PROCALCITONIN: Procalcitonin: 16.55 ng/mL

## 2022-03-09 LAB — TROPONIN I (HIGH SENSITIVITY)
Troponin I (High Sensitivity): 87 ng/L — ABNORMAL HIGH (ref <18)
Troponin I (High Sensitivity): 126 ng/L (ref <18)

## 2022-03-09 LAB — LACTIC ACID, PLASMA
Lactic Acid, Venous: 3.8 mmol/L (ref 0.5–1.9)
Lactic Acid, Venous: 4.4 mmol/L (ref 0.5–1.9)

## 2022-03-09 LAB — ABO/RH: ABO/RH(D): O POS

## 2022-03-09 LAB — C-REACTIVE PROTEIN: CRP: 46.9 mg/dL — ABNORMAL HIGH (ref <1.0)

## 2022-03-09 LAB — CK TOTAL AND CKMB (NOT AT ARMC)
CK, MB: 8.2 ng/mL — ABNORMAL HIGH (ref 0.5–5.0)
Relative Index: INVALID (ref 0.0–2.5)
Total CK: 56 U/L (ref 49–397)

## 2022-03-09 LAB — BILIRUBIN, DIRECT
Bilirubin, Direct: 0.3 mg/dL — ABNORMAL HIGH (ref 0.0–0.2)
Bilirubin, Direct: 0.7 mg/dL — ABNORMAL HIGH (ref 0.0–0.2)

## 2022-03-09 LAB — APTT
aPTT: 43 seconds — ABNORMAL HIGH (ref 24–36)
aPTT: 45 seconds — ABNORMAL HIGH (ref 24–36)

## 2022-03-09 MED ORDER — SODIUM CHLORIDE 0.9 % IV SOLN
1.0000 g | Freq: Once | INTRAVENOUS | Status: AC
  Administered 2022-03-09: 1 g via INTRAVENOUS
  Filled 2022-03-09: qty 10

## 2022-03-09 MED ORDER — ALBUMIN HUMAN 25 % IV SOLN
12.5000 g | Freq: Once | INTRAVENOUS | Status: AC
  Administered 2022-03-09: 12.5 g via INTRAVENOUS
  Filled 2022-03-09: qty 50

## 2022-03-09 MED ORDER — GELATIN ABSORBABLE 12-7 MM EX MISC
CUTANEOUS | Status: AC
  Filled 2022-03-09: qty 1

## 2022-03-09 MED ORDER — GELATIN ABSORBABLE 12-7 MM EX MISC
1.0000 | Freq: Once | CUTANEOUS | Status: AC
  Filled 2022-03-09: qty 1

## 2022-03-09 MED ORDER — FUROSEMIDE 10 MG/ML IJ SOLN
20.0000 mg | Freq: Once | INTRAMUSCULAR | Status: AC
  Administered 2022-03-09: 20 mg via INTRAVENOUS
  Filled 2022-03-09: qty 2

## 2022-03-09 MED ORDER — LEVOTHYROXINE SODIUM 100 MCG/5ML IV SOLN
20.0000 ug | Freq: Once | INTRAVENOUS | Status: AC
  Administered 2022-03-09: 20 ug via INTRAVENOUS
  Filled 2022-03-09: qty 5

## 2022-03-09 MED ORDER — ALBUMIN HUMAN 5 % IV SOLN
25.0000 g | Freq: Once | INTRAVENOUS | Status: AC
  Administered 2022-03-09: 25 g via INTRAVENOUS
  Filled 2022-03-09: qty 500

## 2022-03-09 MED ORDER — SODIUM CHLORIDE 0.9 % IV SOLN
3.0000 g | Freq: Two times a day (BID) | INTRAVENOUS | Status: DC
  Administered 2022-03-09 – 2022-03-10 (×2): 3 g via INTRAVENOUS
  Filled 2022-03-09 (×2): qty 8

## 2022-03-09 MED ORDER — SODIUM CHLORIDE 0.9 % IV SOLN
1000.0000 mg | Freq: Once | INTRAVENOUS | Status: AC
  Administered 2022-03-09: 1000 mg via INTRAVENOUS
  Filled 2022-03-09: qty 16

## 2022-03-09 MED ORDER — POTASSIUM CHLORIDE 10 MEQ/50ML IV SOLN
10.0000 meq | INTRAVENOUS | Status: AC
  Administered 2022-03-09 – 2022-03-10 (×4): 10 meq via INTRAVENOUS
  Filled 2022-03-09 (×4): qty 50

## 2022-03-09 MED ORDER — SODIUM CHLORIDE 0.9 % IV SOLN
2000.0000 mg | Freq: Two times a day (BID) | INTRAVENOUS | Status: DC
  Administered 2022-03-09 – 2022-03-10 (×2): 2000 mg via INTRAVENOUS
  Filled 2022-03-09: qty 16
  Filled 2022-03-09 (×3): qty 32

## 2022-03-09 MED ORDER — INSULIN ASPART 100 UNIT/ML IJ SOLN
20.0000 [IU] | Freq: Once | INTRAMUSCULAR | Status: AC
  Administered 2022-03-09: 20 [IU] via SUBCUTANEOUS

## 2022-03-09 MED ORDER — LACTATED RINGERS IV BOLUS
500.0000 mL | Freq: Once | INTRAVENOUS | Status: AC
  Administered 2022-03-09: 500 mL via INTRAVENOUS

## 2022-03-09 MED ORDER — INSULIN REGULAR(HUMAN) IN NACL 100-0.9 UT/100ML-% IV SOLN
INTRAVENOUS | Status: DC
  Administered 2022-03-09: 13 [IU]/h via INTRAVENOUS
  Administered 2022-03-09: 14 [IU]/h via INTRAVENOUS
  Administered 2022-03-09: 13 [IU]/h via INTRAVENOUS
  Administered 2022-03-10: 7.5 [IU]/h via INTRAVENOUS
  Filled 2022-03-09 (×3): qty 100

## 2022-03-09 MED ORDER — DEXTROSE 50 % IV SOLN: 50.0000 mL | Freq: Once | INTRAVENOUS | Status: DC

## 2022-03-09 MED ORDER — EPINEPHRINE HCL 5 MG/250ML IV SOLN IN NS
0.5000 ug/min | INTRAVENOUS | Status: DC
  Administered 2022-03-09: 2 ug/min via INTRAVENOUS
  Filled 2022-03-09: qty 250

## 2022-03-09 MED ORDER — SODIUM CHLORIDE 0.9 % IV SOLN
10.0000 ug/h | INTRAVENOUS | Status: DC
  Administered 2022-03-09: 10 ug/h via INTRAVENOUS
  Administered 2022-03-10 (×2): 20 ug/h via INTRAVENOUS
  Filled 2022-03-09 (×4): qty 10

## 2022-03-09 NOTE — Progress Notes (Signed)
NAME:  Lance Crosby, MRN:  [id number], DOB:  February 20, 1946, LOS: 3 ADMISSION DATE:  02/18/2022, CONSULTATION DATE:  03/09/22 REFERRING MD:  EDP, CHIEF COMPLAINT:  PEA arrest   History of Present Illness:  Lance Crosby is a 76 y.o. M with PMH of colon Ca, DM, GERD, HTN, arthritis with recent ED visit for bee sting on prednisone taper who was brought in by EMS unresponsive after his wife heard him fall out of bed.  He had been complaining of generalized weakness for two days prior.   He was found in PEA arrest, (seems to have been at least 10 mins before chest compressions started) initially coded 15-20 minutes then an additional 2 minutes.  He was intubated without medication and brought to the ED where he required pacing initially then epinephrine gtt.    CT head/C-spine and xray pelvis significant for L temporal lobe gray/white matter in the L temporal lobe, type 2 dens fracture with retropulsion 2-22m, likely pulmonary edema and RUL opacity.   Labs significant for Glucose >700, CMP pending, lactic acid >9, WBC 17k, creatinine 1.3.  PCCM consulted for admission.  Pertinent  Medical History   has a past medical history of Arthritis, Claustrophobia, [place] cancer [place]), Diabetes mellitus without complication ([place], GERD (gastroesophageal reflux disease), and Hypertension.   Significant Hospital Events: Including procedures, antibiotic start and stop dates in addition to other pertinent events   11/21 PEA arrest, intubated, on epi gtt, PCCM admit  Interim History / Subjective:  Patient had apnea test yesterday, which was consistent with brain-dead, he was declared dead on 106-Dec-2023at 4:32 PM He has been evaluated by HKelli Churnfor possible organ procurement   Objective   Blood pressure 104/72, pulse (!) 126, temperature (!) 100.9 F (38.3 C), temperature source Oral, resp. rate (!) 32, height '5\' 9"'$  (1.753 m), weight 85.9 kg, SpO2 97 %. CVP:  [10 mmHg-25 mmHg] 15 mmHg  Vent  Mode: PRVC FiO2 (%):  [70 %-80 %] 70 % Set Rate:  [32 bmp] 32 bmp Vt Set:  [560 mL] 560 mL PEEP:  [10 cIOX73-53cmH20] 10 cmH20 Plateau Pressure:  [25 cmH20-30 cmH20] 25 cmH20   Intake/Output Summary (Last 24 hours) at 03/09/2022 0959 Last data filed at 03/09/2022 0900 Gross per 24 hour  Intake 1550.36 ml  Output 1080 ml  Net 470.36 ml   Filed Weights   03/07/2022 1851 03/07/22 0424 [id number]  Weight: 99.8 kg 82.8 kg 85.9 kg     Physical exam: General: Crtitically ill-appearing male, orally intubated HEENT: Superficial laceration noted on forehead, AT, eyes anicteric.  ETT and OGT in place Neuro: Eyes closed, does not open, not following commands.  Pupils dilated and fixed, absent corneals, cough, gag or response to painful stimuli Chest: Coarse breath sounds, no wheezes or rhonchi Heart: Regular rate and rhythm, no murmurs or gallops Abdomen: Soft, nontender, nondistended, bowel sounds present Skin: Superficial abrasions noted to the face  Resolved Hospital Problem list     Assessment & Plan:  Out of hospital PEA cardiac arrest, likely in the setting of electrolyte abnormalities Hypokalemia/hypocalcemia Post arrest anoxic encephalopathy Acute hypoxic/hypercapnic respiratory failure Septic shock with ARDS due to aspiration pneumonia Type II Dens fracture Recent admit for bee sting New diagnosis of DM2 with hyperglycemia likely in the setting of steroid use Acute kidney injury due to ischemic ATN in the setting of cardiac arrest Shock liver Acute metabolic acidosis/lactic acidosis Brain dead   Patient was clinically, supported by imaging  and per apnea test declared brain dead He is being evaluated by Kelli Churn for possible organ procurement Remain on IV vasopressor support to maintain map goal 65 Continue full support mechanical ventilation   Best Practice (right click and "Reselect all SmartList Selections" daily)   Diet/type: NPO DVT prophylaxis:  prophylactic heparin  GI prophylaxis: PPI Lines: Central line Foley:  Yes, and it is still needed Code Status:  DNR Last date of multidisciplinary goals of care discussion [11/23: Updated patient's daughter and sister at bedside  Labs   CBC: Recent Labs  Lab 02/24/2022 1856 02/18/2022 1857 02/16/2022 2155 03/15/2022 2159 03/07/22 0214 03/07/22 0328 03/07/22 1211 02/26/2022 0307 03/15/2022 0359 03/01/2022 1616 03/11/2022 1631  WBC 17.4*  --  5.4  --  4.5  --   --   --  13.9*  --   --   NEUTROABS 10.0*  --   --   --   --   --   --   --   --   --   --   HGB 12.3*   < > 14.7   < > 13.6   < > 14.3 13.3 14.3 12.6* 13.6  HCT 38.8*   < > 43.6   < > 38.2*   < > 42.0 39.0 40.2 37.0* 40.0  MCV 95.8  --  89.5  --  84.5  --   --   --  83.9  --   --   PLT 178  --  233  --  205  --   --   --  115*  --   --    < > = values in this interval not displayed.    Basic Metabolic Panel: Recent Labs  Lab 03/07/2022 1856 03/12/2022 1857 03/13/2022 2036 03/07/22 0214 03/07/22 0328 03/07/22 0805 03/07/22 1211 03/07/22 1334 02/28/2022 0307 03/02/2022 0359 03/13/2022 1616 02/27/2022 1631  NA 138 135   < > 149*   < > 145 142  --  140 141 139 141  K 4.1 4.4   < > 2.7*   < > 3.1* 3.8 3.8 3.2* 3.2* 3.8 3.5  CL 100 102  --  111  --  106  --   --   --  98  --   --   CO2 16*  --   --  29  --  21*  --   --   --  25  --   --   GLUCOSE 751* >700*  --  253*  --  138*  --   --   --  150*  --   --   BUN 18 19  --  20  --  19  --   --   --  13  --   --   CREATININE 1.80* 1.30*  --  1.72*  --  1.64*  --   --   --  1.20  --   --   CALCIUM 11.4*  --   --  8.0*  --  8.4*  --   --   --  8.4*  --   --   MG  --   --   --  1.6*  --   --   --   --   --  1.8  --   --   PHOS  --   --   --  4.1  --   --   --   --   --  3.8  --   --    < > =  values in this interval not displayed.   GFR: Estimated Creatinine Clearance: 56.9 mL/min (by C-G formula based on SCr of 1.2 mg/dL). Recent Labs  Lab 03/03/2022 1856 02/21/2022 2115 03/12/2022 2155  03/07/22 0214 03/07/22 0224 03/07/22 0905 03/07/22 1146 03/11/2022 0359  WBC 17.4*  --  5.4 4.5  --   --   --  13.9*  LATICACIDVEN >9.0* 7.5*  --   --  4.7* 5.3* 6.4*  --     Liver Function Tests: Recent Labs  Lab 02/19/2022 1856 03/07/22 0214 03/14/2022 0359  AST 305* 204* 86*  ALT 268* 226* 148*  ALKPHOS 145* 93 64  BILITOT 0.7 0.5 1.3*  PROT 5.1* 4.5* 5.2*  ALBUMIN 2.8* 2.4* 2.4*   No results for input(s): "LIPASE", "AMYLASE" in the last 168 hours. No results for input(s): "AMMONIA" in the last 168 hours.  ABG    Component Value Date/Time   PHART 7.228 (L) 03/07/2022 1631   PCO2ART 67.0 (HH) 02/17/2022 1631   PO2ART 94 02/28/2022 1631   HCO3 28.1 (H) 03/14/2022 1631   TCO2 30 03/09/2022 1631   ACIDBASEDEF 1.0 02/28/2022 1631   O2SAT 96 03/03/2022 1631     Coagulation Profile: No results for input(s): "INR", "PROTIME" in the last 168 hours.  Cardiac Enzymes: No results for input(s): "CKTOTAL", "CKMB", "CKMBINDEX", "TROPONINI" in the last 168 hours.  HbA1C: Hgb A1c MFr Bld  Date/Time Value Ref Range Status  02/23/2022 03:59 AM 11.4 (H) 4.8 - 5.6 % Final    Comment:    (NOTE)         Prediabetes: 5.7 - 6.4         Diabetes: >6.4         Glycemic control for adults with diabetes: <7.0   06/08/2019 03:05 PM 8.8 (H) 4.8 - 5.6 % Final    Comment:    (NOTE) Pre diabetes:          5.7%-6.4% Diabetes:              >6.4% Glycemic control for   <7.0% adults with diabetes     CBG: Recent Labs  Lab 02/22/2022 0700 02/18/2022 0837 03/15/2022 1632 03/09/22 0649 03/09/22 0754  GLUCAP 166* 127* 239* 362* 321*    Total critical care time: 32 minutes  Performed by: Harman care time was exclusive of separately billable procedures and treating other patients.   Critical care was necessary to treat or prevent imminent or life-threatening deterioration.   Critical care was time spent personally by me on the following activities: development of  treatment plan with patient and/or surrogate as well as nursing, discussions with consultants, evaluation of patient's response to treatment, examination of patient, obtaining history from patient or surrogate, ordering and performing treatments and interventions, ordering and review of laboratory studies, ordering and review of radiographic studies, pulse oximetry and re-evaluation of patient's condition.   Jacky Kindle, MD [facility] See Amion for pager If no response to pager, please call [phone] until 7pm After 7pm, Please call E-link [phone]

## 2022-03-09 NOTE — Procedures (Signed)
Vascular and Interventional Radiology Procedure Note  Patient: ZAKARIYA KNICKERBOCKER DOB: 04/14/1946 Medical Record Number: [id number] Note Date/Time: 03/09/22 3:25 PM   Performing Physician: Michaelle Birks, MD Assistant(s): None  Diagnosis: Organ Donation  Procedure: DONOR LIVER BIOPSY  Anesthesia: Local Anesthetic Complications: None Estimated Blood Loss: Minimal Specimens: Sent for Pathology  Findings:  Successful Ultrasound-guided biopsy of liver. A total of 3 samples were obtained. Hemostasis of the tract was achieved using Gelfoam Slurry Embolization.  Plan: Bed rest for 1 hours.  See detailed procedure note with images in PACS. The patient tolerated the procedure well without incident or complication and was returned to ICU in stable condition.    Michaelle Birks, MD [facility] Interventional Radiology [facility] Radiology   Pager. [place]

## 2022-03-09 NOTE — Progress Notes (Signed)
[place] Progress Note Patient Name: Lance Crosby DOB: 29-Nov-1945 MRN: [id number]   Date of Service  03/09/2022  HPI/Events of Note  RN reports BP soft, now 91/62 (70), HR 123 afib.  On 40 mcg levo, no MIVF infusing.  Potential organ donor  eICU Interventions  500 cc LR bolus ordered     Intervention Category Intermediate Interventions: Hypotension - evaluation and management  Judd Lien 03/09/2022, 3:44 AM

## 2022-03-09 NOTE — Progress Notes (Signed)
Received call from [facility] with [employer] He plans to meet with pt's family at 8 for authorization of organ donation.

## 2022-03-09 NOTE — Inpatient Diabetes Management (Signed)
Inpatient Diabetes Program Recommendations  AACE/ADA: New Consensus Statement on Inpatient Glycemic Control (2015)  Target Ranges:  Prepandial:   less than 140 mg/dL      Peak postprandial:   less than 180 mg/dL (1-2 hours)      Critically ill patients:  140 - 180 mg/dL   Lab Results  Component Value Date   GLUCAP 321 (H) 03/09/2022   HGBA1C 11.4 (H) 02/26/2022    Review of Glycemic Control  Latest Reference Range & Units 03/03/2022 07:00 03/09/2022 08:37 02/16/2022 16:32 03/09/22 06:49 03/09/22 07:54  Glucose-Capillary 70 - 99 mg/dL 166 (H) 127 (H) 239 (H) 362 (H) 321 (H)   Diabetes history: DM 2  Inpatient Diabetes Program Recommendations:    -   Glucose trends in 300 range. Consider starting IV insulin for glucose targets for potential organ donation.  Thanks,  Tama Headings RN, MSN, BC-ADM Inpatient Diabetes Coordinator Team Pager [phone] (8a-5p)

## 2022-03-09 NOTE — TOC CAGE-AID Note (Signed)
Transition of Care [facility]) - CAGE-AID Screening   Patient Details  Name: Lance Crosby MRN: [id number] Date of Birth: [birth year]  [facility] of [facility]) CM/SW Contact:    Army Melia, RN Phone [phone] 03/09/2022, 4:22 AM   Clinical Narrative:  Presents after a fall and cardiac arrest. Intubated, brain death declared 2023-04-04, unable to screen.  CAGE-AID Screening: Substance Abuse Screening unable to be completed due to: : Patient unable to participate

## 2022-03-09 NOTE — Progress Notes (Signed)
Date and time results received: 03/09/22 1947 (use smartphrase ".now" to insert current time)  Test: Troponin  Critical Value: 126   Test: Lactic Acid  Critical Value: 4.4  Name of Provider Notified: [facility] Judson Roch   Orders Received? Or Actions Taken?: Patient hemodynamically stable. Continuing to monitor.

## 2022-03-09 NOTE — Progress Notes (Signed)
RT note- Patient taken to CT and now back in 2H. Fio2 and peep adjusted per donor services.

## 2022-03-10 ENCOUNTER — Encounter (HOSPITAL_COMMUNITY): Admission: EM | Disposition: E | Payer: Self-pay | Source: Home / Self Care | Attending: Internal Medicine

## 2022-03-10 ENCOUNTER — Other Ambulatory Visit: Payer: Self-pay

## 2022-03-10 ENCOUNTER — Inpatient Hospital Stay (HOSPITAL_COMMUNITY): Payer: Medicare PPO

## 2022-03-10 ENCOUNTER — Inpatient Hospital Stay (HOSPITAL_COMMUNITY): Payer: Medicare PPO | Admitting: Critical Care Medicine

## 2022-03-10 DIAGNOSIS — G9382 Brain death: Secondary | ICD-10-CM | POA: Diagnosis not present

## 2022-03-10 DIAGNOSIS — E119 Type 2 diabetes mellitus without complications: Secondary | ICD-10-CM | POA: Diagnosis not present

## 2022-03-10 DIAGNOSIS — Z87891 Personal history of nicotine dependence: Secondary | ICD-10-CM

## 2022-03-10 DIAGNOSIS — Z529 Donor of unspecified organ or tissue: Secondary | ICD-10-CM

## 2022-03-10 DIAGNOSIS — I10 Essential (primary) hypertension: Secondary | ICD-10-CM

## 2022-03-10 HISTORY — PX: ORGAN PROCUREMENT: SHX5270

## 2022-03-10 LAB — POCT I-STAT 7, (LYTES, BLD GAS, ICA,H+H)
Acid-Base Excess: 4 mmol/L — ABNORMAL HIGH (ref 0.0–2.0)
Acid-Base Excess: 5 mmol/L — ABNORMAL HIGH (ref 0.0–2.0)
Acid-Base Excess: 7 mmol/L — ABNORMAL HIGH (ref 0.0–2.0)
Bicarbonate: 28.9 mmol/L — ABNORMAL HIGH (ref 20.0–28.0)
Bicarbonate: 29.4 mmol/L — ABNORMAL HIGH (ref 20.0–28.0)
Bicarbonate: 31.1 mmol/L — ABNORMAL HIGH (ref 20.0–28.0)
Calcium, Ion: 1.21 mmol/L (ref 1.15–1.40)
Calcium, Ion: 1.23 mmol/L (ref 1.15–1.40)
Calcium, Ion: 1.25 mmol/L (ref 1.15–1.40)
HCT: 31 % — ABNORMAL LOW (ref 39.0–52.0)
HCT: 32 % — ABNORMAL LOW (ref 39.0–52.0)
HCT: 33 % — ABNORMAL LOW (ref 39.0–52.0)
Hemoglobin: 10.5 g/dL — ABNORMAL LOW (ref 13.0–17.0)
Hemoglobin: 10.9 g/dL — ABNORMAL LOW (ref 13.0–17.0)
Hemoglobin: 11.2 g/dL — ABNORMAL LOW (ref 13.0–17.0)
O2 Saturation: 100 %
O2 Saturation: 100 %
O2 Saturation: 100 %
Patient temperature: 98.1
Patient temperature: 98.2
Patient temperature: 98.8
Potassium: 3.3 mmol/L — ABNORMAL LOW (ref 3.5–5.1)
Potassium: 3.4 mmol/L — ABNORMAL LOW (ref 3.5–5.1)
Potassium: 3.4 mmol/L — ABNORMAL LOW (ref 3.5–5.1)
Sodium: 146 mmol/L — ABNORMAL HIGH (ref 135–145)
Sodium: 153 mmol/L — ABNORMAL HIGH (ref 135–145)
Sodium: 155 mmol/L — ABNORMAL HIGH (ref 135–145)
TCO2: 30 mmol/L (ref 22–32)
TCO2: 31 mmol/L (ref 22–32)
TCO2: 32 mmol/L (ref 22–32)
pCO2 arterial: 41 mmHg (ref 32–48)
pCO2 arterial: 41.8 mmHg (ref 32–48)
pCO2 arterial: 42.9 mmHg (ref 32–48)
pH, Arterial: 7.435 (ref 7.35–7.45)
pH, Arterial: 7.456 — ABNORMAL HIGH (ref 7.35–7.45)
pH, Arterial: 7.487 — ABNORMAL HIGH (ref 7.35–7.45)
pO2, Arterial: 326 mmHg — ABNORMAL HIGH (ref 83–108)
pO2, Arterial: 356 mmHg — ABNORMAL HIGH (ref 83–108)
pO2, Arterial: 363 mmHg — ABNORMAL HIGH (ref 83–108)

## 2022-03-10 LAB — GLUCOSE, CAPILLARY
Glucose-Capillary: 153 mg/dL — ABNORMAL HIGH (ref 70–99)
Glucose-Capillary: 162 mg/dL — ABNORMAL HIGH (ref 70–99)
Glucose-Capillary: 163 mg/dL — ABNORMAL HIGH (ref 70–99)
Glucose-Capillary: 166 mg/dL — ABNORMAL HIGH (ref 70–99)
Glucose-Capillary: 170 mg/dL — ABNORMAL HIGH (ref 70–99)
Glucose-Capillary: 171 mg/dL — ABNORMAL HIGH (ref 70–99)
Glucose-Capillary: 174 mg/dL — ABNORMAL HIGH (ref 70–99)
Glucose-Capillary: 176 mg/dL — ABNORMAL HIGH (ref 70–99)
Glucose-Capillary: 180 mg/dL — ABNORMAL HIGH (ref 70–99)
Glucose-Capillary: 181 mg/dL — ABNORMAL HIGH (ref 70–99)
Glucose-Capillary: 193 mg/dL — ABNORMAL HIGH (ref 70–99)

## 2022-03-10 LAB — CULTURE, RESPIRATORY W GRAM STAIN

## 2022-03-10 LAB — TROPONIN I (HIGH SENSITIVITY)
Troponin I (High Sensitivity): 87 ng/L — ABNORMAL HIGH (ref <18)
Troponin I (High Sensitivity): 66 ng/L — ABNORMAL HIGH (ref <18)
Troponin I (High Sensitivity): 47 ng/L — ABNORMAL HIGH (ref <18)

## 2022-03-10 LAB — URINALYSIS, ROUTINE W REFLEX MICROSCOPIC
Bilirubin Urine: NEGATIVE
Glucose, UA: NEGATIVE mg/dL
Ketones, ur: NEGATIVE mg/dL
Nitrite: NEGATIVE
Protein, ur: 30 mg/dL — AB
Specific Gravity, Urine: 1.008 (ref 1.005–1.030)
pH: 5 (ref 5.0–8.0)

## 2022-03-10 LAB — COMPREHENSIVE METABOLIC PANEL
ALT: 62 U/L — ABNORMAL HIGH (ref 0–44)
ALT: 59 U/L — ABNORMAL HIGH (ref 0–44)
ALT: 57 U/L — ABNORMAL HIGH (ref 0–44)
AST: 33 U/L (ref 15–41)
AST: 36 U/L (ref 15–41)
AST: 38 U/L (ref 15–41)
Albumin: 2.8 g/dL — ABNORMAL LOW (ref 3.5–5.0)
Albumin: 2.6 g/dL — ABNORMAL LOW (ref 3.5–5.0)
Albumin: 2.4 g/dL — ABNORMAL LOW (ref 3.5–5.0)
Alkaline Phosphatase: 98 U/L (ref 38–126)
Alkaline Phosphatase: 96 U/L (ref 38–126)
Alkaline Phosphatase: 96 U/L (ref 38–126)
Anion gap: 14 (ref 5–15)
Anion gap: 12 (ref 5–15)
Anion gap: 13 (ref 5–15)
BUN: 29 mg/dL — ABNORMAL HIGH (ref 8–23)
BUN: 27 mg/dL — ABNORMAL HIGH (ref 8–23)
BUN: 26 mg/dL — ABNORMAL HIGH (ref 8–23)
CO2: 24 mmol/L (ref 22–32)
CO2: 24 mmol/L (ref 22–32)
CO2: 25 mmol/L (ref 22–32)
Calcium: 9.1 mg/dL (ref 8.9–10.3)
Calcium: 9.1 mg/dL (ref 8.9–10.3)
Calcium: 9.1 mg/dL (ref 8.9–10.3)
Chloride: 110 mmol/L (ref 98–111)
Chloride: 114 mmol/L — ABNORMAL HIGH (ref 98–111)
Chloride: 116 mmol/L — ABNORMAL HIGH (ref 98–111)
Creatinine, Ser: 2.56 mg/dL — ABNORMAL HIGH (ref 0.61–1.24)
Creatinine, Ser: 2.3 mg/dL — ABNORMAL HIGH (ref 0.61–1.24)
Creatinine, Ser: 2.17 mg/dL — ABNORMAL HIGH (ref 0.61–1.24)
GFR, Estimated: 25 mL/min — ABNORMAL LOW (ref >60)
GFR, Estimated: 29 mL/min — ABNORMAL LOW (ref >60)
GFR, Estimated: 31 mL/min — ABNORMAL LOW (ref >60)
Glucose, Bld: 205 mg/dL — ABNORMAL HIGH (ref 70–99)
Glucose, Bld: 182 mg/dL — ABNORMAL HIGH (ref 70–99)
Glucose, Bld: 172 mg/dL — ABNORMAL HIGH (ref 70–99)
Potassium: 3.4 mmol/L — ABNORMAL LOW (ref 3.5–5.1)
Potassium: 3.4 mmol/L — ABNORMAL LOW (ref 3.5–5.1)
Potassium: 3.4 mmol/L — ABNORMAL LOW (ref 3.5–5.1)
Sodium: 148 mmol/L — ABNORMAL HIGH (ref 135–145)
Sodium: 150 mmol/L — ABNORMAL HIGH (ref 135–145)
Sodium: 154 mmol/L — ABNORMAL HIGH (ref 135–145)
Total Bilirubin: 2.4 mg/dL — ABNORMAL HIGH (ref 0.3–1.2)
Total Bilirubin: 2.6 mg/dL — ABNORMAL HIGH (ref 0.3–1.2)
Total Bilirubin: 2.8 mg/dL — ABNORMAL HIGH (ref 0.3–1.2)
Total Protein: 5.6 g/dL — ABNORMAL LOW (ref 6.5–8.1)
Total Protein: 5.5 g/dL — ABNORMAL LOW (ref 6.5–8.1)
Total Protein: 5.4 g/dL — ABNORMAL LOW (ref 6.5–8.1)

## 2022-03-10 LAB — CBC
HCT: 31.8 % — ABNORMAL LOW (ref 39.0–52.0)
Hemoglobin: 11.2 g/dL — ABNORMAL LOW (ref 13.0–17.0)
MCH: 30.3 pg (ref 26.0–34.0)
MCHC: 35.2 g/dL (ref 30.0–36.0)
MCV: 85.9 fL (ref 80.0–100.0)
Platelets: 68 10*3/uL — ABNORMAL LOW (ref 150–400)
RBC: 3.7 MIL/uL — ABNORMAL LOW (ref 4.22–5.81)
RDW: 15.3 % (ref 11.5–15.5)
WBC: 11.6 10*3/uL — ABNORMAL HIGH (ref 4.0–10.5)
nRBC: 0.2 % (ref 0.0–0.2)

## 2022-03-10 LAB — PROTIME-INR
INR: 1.3 — ABNORMAL HIGH (ref 0.8–1.2)
INR: 1.3 — ABNORMAL HIGH (ref 0.8–1.2)
INR: 1.4 — ABNORMAL HIGH (ref 0.8–1.2)
Prothrombin Time: 16.3 seconds — ABNORMAL HIGH (ref 11.4–15.2)
Prothrombin Time: 16.5 seconds — ABNORMAL HIGH (ref 11.4–15.2)
Prothrombin Time: 16.6 seconds — ABNORMAL HIGH (ref 11.4–15.2)

## 2022-03-10 LAB — CALCIUM, IONIZED: Calcium, Ionized, Serum: 4.5 mg/dL (ref 4.5–5.6)

## 2022-03-10 LAB — LACTIC ACID, PLASMA
Lactic Acid, Venous: 3.4 mmol/L (ref 0.5–1.9)
Lactic Acid, Venous: 3.1 mmol/L (ref 0.5–1.9)
Lactic Acid, Venous: 2.7 mmol/L (ref 0.5–1.9)

## 2022-03-10 LAB — BILIRUBIN, DIRECT
Bilirubin, Direct: 1 mg/dL — ABNORMAL HIGH (ref 0.0–0.2)
Bilirubin, Direct: 1.1 mg/dL — ABNORMAL HIGH (ref 0.0–0.2)
Bilirubin, Direct: 1.3 mg/dL — ABNORMAL HIGH (ref 0.0–0.2)

## 2022-03-10 LAB — APTT
aPTT: 41 seconds — ABNORMAL HIGH (ref 24–36)
aPTT: 42 seconds — ABNORMAL HIGH (ref 24–36)
aPTT: 44 seconds — ABNORMAL HIGH (ref 24–36)

## 2022-03-10 SURGERY — SURGICAL PROCUREMENT, ORGAN
Anesthesia: General

## 2022-03-10 MED ORDER — HEPARIN SODIUM (PORCINE) 1000 UNIT/ML IJ SOLN
INTRAMUSCULAR | Status: DC | PRN
  Administered 2022-03-10: 30000 [IU] via INTRAVENOUS

## 2022-03-10 MED ORDER — POTASSIUM CHLORIDE 10 MEQ/50ML IV SOLN
10.0000 meq | INTRAVENOUS | Status: AC
  Administered 2022-03-10 (×2): 10 meq via INTRAVENOUS
  Filled 2022-03-10: qty 50

## 2022-03-10 MED ORDER — SODIUM CHLORIDE 0.9 % IV SOLN
1000.0000 mg | Freq: Once | INTRAVENOUS | Status: AC
  Administered 2022-03-10: 1000 mg via INTRAVENOUS
  Filled 2022-03-10: qty 16

## 2022-03-10 MED ORDER — HEPARIN SODIUM (PORCINE) 1000 UNIT/ML IJ SOLN
INTRAMUSCULAR | Status: AC
  Filled 2022-03-10: qty 1

## 2022-03-10 MED ORDER — ROCURONIUM BROMIDE 10 MG/ML (PF) SYRINGE
PREFILLED_SYRINGE | INTRAVENOUS | Status: DC | PRN
  Administered 2022-03-10: 20 mg via INTRAVENOUS
  Administered 2022-03-10: 50 mg via INTRAVENOUS
  Administered 2022-03-10: 30 mg via INTRAVENOUS

## 2022-03-10 MED ORDER — PIPERACILLIN-TAZOBACTAM 3.375 G IVPB 30 MIN
3.3750 g | Freq: Once | INTRAVENOUS | Status: AC
  Administered 2022-03-10: 3.375 g via INTRAVENOUS
  Filled 2022-03-10: qty 50

## 2022-03-10 SURGICAL SUPPLY — 87 items
APPLIER CLIP 11 MED OPEN (CLIP) ×1
APR CLP MED 11 20 MLT OPN (CLIP) ×1
BAG COUNTER SPONGE SURGICOUNT (BAG) ×1 IMPLANT
BAG SPNG CNTER NS LX DISP (BAG) ×1
BLADE CLIPPER SURG (BLADE) IMPLANT
BLADE SAW STERNAL (BLADE) ×1 IMPLANT
BLADE SURG 10 STRL SS (BLADE) IMPLANT
CLIP APPLIE 11 MED OPEN (CLIP) ×1 IMPLANT
CLIP VESOCCLUDE MED 24/CT (CLIP) IMPLANT
CLIP VESOCCLUDE SM WIDE 24/CT (CLIP) IMPLANT
CNTNR URN SCR LID CUP LEK RST (MISCELLANEOUS) ×1 IMPLANT
CONT SPEC 4OZ STRL OR WHT (MISCELLANEOUS) ×4
COVER BACK TABLE 60X90IN (DRAPES) IMPLANT
COVER MAYO STAND STRL (DRAPES) IMPLANT
COVER SURGICAL LIGHT HANDLE (MISCELLANEOUS) ×1 IMPLANT
DRAPE HALF SHEET 40X57 (DRAPES) IMPLANT
DRAPE SLUSH MACHINE 52X66 (DRAPES) ×1 IMPLANT
DRSG COVADERM 4X10 (GAUZE/BANDAGES/DRESSINGS) IMPLANT
DRSG TELFA 3X8 NADH STRL (GAUZE/BANDAGES/DRESSINGS) ×1 IMPLANT
DURAPREP 26ML APPLICATOR (WOUND CARE) IMPLANT
ELECT BLADE 6.5 EXT (BLADE) IMPLANT
ELECT REM PT RETURN 9FT ADLT (ELECTROSURGICAL) ×1
ELECTRODE REM PT RTRN 9FT ADLT (ELECTROSURGICAL) ×2 IMPLANT
GAUZE 4X4 16PLY ~~LOC~~+RFID DBL (SPONGE) IMPLANT
GLOVE BIO SURGEON STRL SZ7 (GLOVE) IMPLANT
GLOVE BIO SURGEON STRL SZ7.5 (GLOVE) IMPLANT
GLOVE BIO SURGEON STRL SZ8 (GLOVE) IMPLANT
GLOVE BIO SURGEON STRL SZ8.5 (GLOVE) IMPLANT
GLOVE BIOGEL PI IND STRL 7.0 (GLOVE) IMPLANT
GLOVE BIOGEL PI IND STRL 7.5 (GLOVE) IMPLANT
GLOVE BIOGEL PI IND STRL 8 (GLOVE) IMPLANT
GLOVE BIOGEL PI IND STRL 8.5 (GLOVE) IMPLANT
GLOVE SURG SS PI 7.0 STRL IVOR (GLOVE) IMPLANT
GLOVE SURG SS PI 7.5 STRL IVOR (GLOVE) IMPLANT
GLOVE SURG SS PI 8.0 STRL IVOR (GLOVE) IMPLANT
GOWN STRL REUS W/ TWL LRG LVL3 (GOWN DISPOSABLE) ×4 IMPLANT
GOWN STRL REUS W/ TWL XL LVL3 (GOWN DISPOSABLE) ×2 IMPLANT
GOWN STRL REUS W/TWL LRG LVL3 (GOWN DISPOSABLE)
GOWN STRL REUS W/TWL XL LVL3 (GOWN DISPOSABLE) ×6
HANDLE SUCTION POOLE (INSTRUMENTS) IMPLANT
KIT POST MORTEM ADULT 36X90 (BAG) ×1 IMPLANT
KIT TURNOVER KIT B (KITS) ×1 IMPLANT
LOOP VESSEL MAXI 1X406 BLUE (MISCELLANEOUS)
LOOP VESSEL MAXI BLUE (MISCELLANEOUS) IMPLANT
LOOP VESSEL MINI RED (MISCELLANEOUS) IMPLANT
MANIFOLD NEPTUNE II (INSTRUMENTS) ×1 IMPLANT
MARKER SKIN DUAL TIP RULER LAB (MISCELLANEOUS) IMPLANT
NDL BIOPSY 14X6 SOFT TISS (NEEDLE) IMPLANT
NEEDLE BIOPSY 14X6 SOFT TISS (NEEDLE) IMPLANT
NS IRRIG 1000ML POUR BTL (IV SOLUTION) IMPLANT
PACK AORTA (CUSTOM PROCEDURE TRAY) ×1 IMPLANT
PAD ARMBOARD 7.5X6 YLW CONV (MISCELLANEOUS) ×2 IMPLANT
PENCIL BUTTON HOLSTER BLD 10FT (ELECTRODE) ×1 IMPLANT
SOL PREP POV-IOD 4OZ 10% (MISCELLANEOUS) ×2 IMPLANT
SPONGE INTESTINAL PEANUT (DISPOSABLE) IMPLANT
SPONGE T-LAP 18X18 ~~LOC~~+RFID (SPONGE) IMPLANT
STAPLER VISISTAT 35W (STAPLE) ×1 IMPLANT
SUCTION POOLE HANDLE (INSTRUMENTS) ×2
SUT BONE WAX W31G (SUTURE) IMPLANT
SUT ETHIBOND 5 LR DA (SUTURE) IMPLANT
SUT ETHILON 1 LR 30 (SUTURE) ×2 IMPLANT
SUT ETHILON 2 LR (SUTURE) IMPLANT
SUT PROLENE 3 0 RB 1 (SUTURE) IMPLANT
SUT PROLENE 3 0 SH 1 (SUTURE) IMPLANT
SUT PROLENE 4 0 RB 1 (SUTURE)
SUT PROLENE 4 0 SH DA (SUTURE) IMPLANT
SUT PROLENE 4-0 RB1 .5 CRCL 36 (SUTURE) IMPLANT
SUT PROLENE 5 0 C 1 24 (SUTURE) IMPLANT
SUT PROLENE 6 0 BV (SUTURE) IMPLANT
SUT SILK 0 TIES 10X30 (SUTURE) IMPLANT
SUT SILK 1 SH (SUTURE) IMPLANT
SUT SILK 1 TIES 10X30 (SUTURE) IMPLANT
SUT SILK 2 0 (SUTURE)
SUT SILK 2 0 SH (SUTURE) IMPLANT
SUT SILK 2 0 SH CR/8 (SUTURE) IMPLANT
SUT SILK 2 0 TIES 10X30 (SUTURE) IMPLANT
SUT SILK 2-0 18XBRD TIE 12 (SUTURE) IMPLANT
SUT SILK 3 0 SH CR/8 (SUTURE) IMPLANT
SUT SILK 3 0 TIES 10X30 (SUTURE) IMPLANT
SWAB COLLECTION DEVICE MRSA (MISCELLANEOUS) IMPLANT
SWAB CULTURE ESWAB REG 1ML (MISCELLANEOUS) IMPLANT
SYR 50ML LL SCALE MARK (SYRINGE) IMPLANT
SYRINGE TOOMEY DISP (SYRINGE) IMPLANT
TAPE UMBILICAL 1/8 X36 TWILL (MISCELLANEOUS) IMPLANT
TUBE CONNECTING 12X1/4 (SUCTIONS) ×1 IMPLANT
WATER STERILE IRR 1000ML POUR (IV SOLUTION) IMPLANT
YANKAUER SUCT BULB TIP NO VENT (SUCTIONS) ×1 IMPLANT

## 2022-03-10 NOTE — Progress Notes (Signed)
NAME:  MYSHAWN CHIRIBOGA, MRN:  [id number], DOB:  09-Nov-1945, LOS: 4 ADMISSION DATE:  02/23/2022, CONSULTATION DATE:  2022/03/19 REFERRING MD:  EDP, CHIEF COMPLAINT:  PEA arrest   History of Present Illness:  Cederick Broadnax is a 76 y.o. M with PMH of colon Ca, DM, GERD, HTN, arthritis with recent ED visit for bee sting on prednisone taper who was brought in by EMS unresponsive after his wife heard him fall out of bed.  He had been complaining of generalized weakness for two days prior.   He was found in PEA arrest, (seems to have been at least 10 mins before chest compressions started) initially coded 15-20 minutes then an additional 2 minutes.  He was intubated without medication and brought to the ED where he required pacing initially then epinephrine gtt.    CT head/C-spine and xray pelvis significant for L temporal lobe gray/white matter in the L temporal lobe, type 2 dens fracture with retropulsion 2-65m, likely pulmonary edema and RUL opacity.   Labs significant for Glucose >700, CMP pending, lactic acid >9, WBC 17k, creatinine 1.3.  PCCM consulted for admission.  Pertinent  Medical History   has a past medical history of Arthritis, Claustrophobia, Colon cancer ([facility] (2000), Diabetes mellitus without complication ([place], GERD (gastroesophageal reflux disease), and Hypertension.   Significant Hospital Events: Including procedures, antibiotic start and stop dates in addition to other pertinent events   11/21 PEA arrest, intubated, on epi gtt, PCCM admit  Interim History / Subjective:  No overnight issues Patient is scheduled for organ procurement of liver and kidneys today   Objective   Blood pressure 129/87, pulse (!) 106, temperature 97.7 F (36.5 C), temperature source Core, resp. rate (!) 32, height '5\' 9"'$  (1.753 m), weight 89.3 kg, SpO2 99 %. CVP:  [15 mmHg-17 mmHg] 16 mmHg  Vent Mode: PRVC FiO2 (%):  [70 %-100 %] 100 % Set Rate:  [32 bmp] 32 bmp Vt Set:  [560 mL] 560 mL PEEP:  [8  cmH20-10 cmH20] 8 cmH20 Plateau Pressure:  [25 cmH20-26 cmH20] 26 cmH20   Intake/Output Summary (Last 24 hours) at 1December 04, 20230740 Last data filed at 1Dec 04, 20230700 Gross per 24 hour  Intake 3152.57 ml  Output 4435 ml  Net -1282.43 ml   Filed Weights   03/07/22 0424 03/02/2022 0451 112/04/20230500  Weight: 82.8 kg 85.9 kg 89.3 kg     Physical exam: General: Crtitically ill-appearing male, orally intubated HEENT: Superficial laceration noted on forehead, AT, eyes anicteric.  ETT and OGT in place Neuro: Eyes closed, does not open, not following commands.  Pupils dilated and fixed, absent corneals, cough, gag or response to painful stimuli Chest: Coarse breath sounds, no wheezes or rhonchi Heart: Regular rate and rhythm, no murmurs or gallops Abdomen: Soft, nontender, nondistended, bowel sounds present Skin: Superficial abrasions noted to the face  Resolved Hospital Problem list     Assessment & Plan:  Out of hospital PEA cardiac arrest, likely in the setting of electrolyte abnormalities Hypokalemia/hypocalcemia/hyponatremia Post arrest anoxic encephalopathy Acute hypoxic/hypercapnic respiratory failure Septic shock with ARDS due to aspiration pneumonia Type II Dens fracture Recent admit for bee sting New diagnosis of DM2 with hyperglycemia likely in the setting of steroid use Acute kidney injury due to ischemic ATN in the setting of cardiac arrest Shock liver Acute metabolic acidosis/lactic acidosis Brain dead  Continue supportive care including vasopressor, antibiotics and [place] Patient is scheduled for organ procurement today including liver and kidneys Continue levothyroxine Continue vasopressor  support   Labs   CBC: Recent Labs  Lab 02/24/2022 1856 02/18/2022 1857 03/07/22 0214 03/07/22 0328 02/15/2022 0359 02/17/2022 1616 03/09/22 1149 03/09/22 1207 03/09/22 1947 03/09/22 2010 04/06/2022 0004 04/06/22 0019 04-06-22 0621  WBC 17.4*   < > 4.5  --  13.9*  --   11.0*  --  10.1  --  11.6*  --   --   NEUTROABS 10.0*  --   --   --   --   --  9.7*  --   --   --   --   --   --   HGB 12.3*   < > 13.6   < > 14.3   < > 12.5*   < > 11.2* 13.6 11.2* 11.2* 10.5*  HCT 38.8*   < > 38.2*   < > 40.2   < > 37.3*   < > 31.7* 40.0 31.8* 33.0* 31.0*  MCV 95.8   < > 84.5  --  83.9  --  89.0  --  87.1  --  85.9  --   --   PLT 178   < > 205  --  115*  --  103*  --  69*  --  68*  --   --    < > = values in this interval not displayed.    Basic Metabolic Panel: Recent Labs  Lab 03/07/22 0214 03/07/22 0328 02/22/2022 0359 02/23/2022 1616 03/09/22 1149 03/09/22 1207 03/09/22 1550 03/09/22 1947 03/09/22 2010 April 06, 2022 0004 04-06-2022 0019 Apr 06, 2022 0534 04-06-22 0621  NA 149*   < > 141   < > 142   < > 141 145 142 148* 146* 150* 153*  K 2.7*   < > 3.2*   < > 4.1   < > 3.3* 3.2* 3.1* 3.4* 3.3* 3.4* 3.4*  CL 111   < > 98  --  104  --  105 105  --  110  --  114*  --   CO2 29   < > 25  --  20*  --  21* 22  --  24  --  24  --   GLUCOSE 253*   < > 150*  --  394*  --  343* 260*  --  205*  --  182*  --   BUN 20   < > 13  --  32*  --  32* 32*  --  29*  --  27*  --   CREATININE 1.72*   < > 1.20  --  2.83*  --  3.13* 2.83*  --  2.56*  --  2.30*  --   CALCIUM 8.0*   < > 8.4*  --  7.7*  --  8.2* 9.2  --  9.1  --  9.1  --   MG 1.6*  --  1.8  --   --   --   --   --   --   --   --   --   --   PHOS 4.1  --  3.8  --   --   --   --   --   --   --   --   --   --    < > = values in this interval not displayed.   GFR: Estimated Creatinine Clearance: 30.2 mL/min (A) (by C-G formula based on SCr of 2.3 mg/dL (H)). Recent Labs  Lab 02/20/2022 0359 03/09/22 1149 03/09/22 1947 04/06/2022 0004 04-06-2022 0534  PROCALCITON  --  16.55  --   --   --   WBC 13.9* 11.0* 10.1 11.6*  --   LATICACIDVEN  --  3.8* 4.4* 3.4* 3.1*    Liver Function Tests: Recent Labs  Lab 03/09/22 1149 03/09/22 1550 03/09/22 1947 Mar 16, 2022 0004 Mar 16, 2022 0534  AST 32 34 32 33 36  ALT 79* 70* 63* 62* 59*  ALKPHOS  95 92 91 98 96  BILITOT 1.3* 1.4* 1.9* 2.4* 2.6*  PROT 5.1* 4.9* 5.3* 5.6* 5.5*  ALBUMIN 2.0* 2.0* 2.6* 2.8* 2.6*   No results for input(s): "LIPASE", "AMYLASE" in the last 168 hours. No results for input(s): "AMMONIA" in the last 168 hours.  ABG    Component Value Date/Time   PHART 7.487 (H) March 16, 2022 0621   PCO2ART 41.0 2022-03-16 0621   PO2ART 363 (H) 16-Mar-2022 0621   HCO3 31.1 (H) 2022/03/16 0621   TCO2 32 16-Mar-2022 0621   ACIDBASEDEF 1.0 03/09/2022 2010   O2SAT 100 03-16-22 0621     Coagulation Profile: Recent Labs  Lab 03/09/22 1149 03/09/22 1947 2022/03/16 0004 2022/03/16 0534  INR 1.3* 1.4* 1.4* 1.3*    Cardiac Enzymes: Recent Labs  Lab 03/09/22 1149  CKTOTAL 56  CKMB 8.2*    HbA1C: Hgb A1c MFr Bld  Date/Time Value Ref Range Status  03/03/2022 03:59 AM 11.4 (H) 4.8 - 5.6 % Final    Comment:    (NOTE)         Prediabetes: 5.7 - 6.4         Diabetes: >6.4         Glycemic control for adults with diabetes: <7.0   06/08/2019 03:05 PM 8.8 (H) 4.8 - 5.6 % Final    Comment:    (NOTE) Pre diabetes:          5.7%-6.4% Diabetes:              >6.4% Glycemic control for   <7.0% adults with diabetes     CBG: Recent Labs  Lab 2022/03/16 0222 Mar 16, 2022 0337 16-Mar-2022 0421 03/16/2022 0545 2022-03-16 0655  GLUCAP 180* 176* 181* 171* 162*    Total critical care time: 31 minutes  Performed by: [place] care time was exclusive of separately billable procedures and treating other patients.   Critical care was necessary to treat or prevent imminent or life-threatening deterioration.   Critical care was time spent personally by me on the following activities: development of treatment plan with patient and/or surrogate as well as nursing, discussions with consultants, evaluation of patient's response to treatment, examination of patient, obtaining history from patient or surrogate, ordering and performing treatments and interventions, ordering and  review of laboratory studies, ordering and review of radiographic studies, pulse oximetry and re-evaluation of patient's condition.   Jacky Kindle, MD [facility] See Amion for pager If no response to pager, please call [phone] until 7pm After 7pm, Please call E-link [phone]

## 2022-03-10 NOTE — Transfer of Care (Signed)
Immediate Anesthesia Transfer of Care Note  Patient: Lance Crosby  Procedure(s) Performed: ORGAN PROCUREMENT LIVER & KIDNEY  Patient Location:  Care of organ donor patient transferred to surgeon and donor services coordinator within the OR    Anesthesia Type:General  Level of Consciousness: Patient remains intubated per anesthesia plan  Airway & Oxygen Therapy:  Organ donation  Post-op Assessment:  Organ donation  Post vital signs: Reviewed  Last Vitals:  Vitals Value Taken Time  BP    Temp    Pulse    Resp    SpO2      Last Pain:  Vitals:   March 17, 2022 1215  TempSrc:   PainSc: 0-No pain         Complications: No notable events documented.

## 2022-03-11 LAB — CULTURE, BLOOD (ROUTINE X 2): Culture: NO GROWTH

## 2022-03-11 LAB — URINE CULTURE: Culture: NO GROWTH

## 2022-03-11 LAB — CALCIUM, IONIZED
Calcium, Ionized, Serum: 5.2 mg/dL (ref 4.5–5.6)
Calcium, Ionized, Serum: 5.5 mg/dL (ref 4.5–5.6)

## 2022-03-12 ENCOUNTER — Encounter (HOSPITAL_COMMUNITY): Payer: Self-pay

## 2022-03-12 LAB — CULTURE, BLOOD (ROUTINE X 2)
Culture: NO GROWTH
Special Requests: ADEQUATE

## 2022-03-12 NOTE — Anesthesia Preprocedure Evaluation (Addendum)
Anesthesia Evaluation   Patient unresponsive    Reviewed: Allergy & Precautions, H&P , NPO status , Patient's Chart, lab work & pertinent test results, Unable to perform ROS - Chart review only  Airway Mallampati: Intubated       Dental   Pulmonary former smoker   breath sounds clear to auscultation       Cardiovascular hypertension,  Rhythm:regular Rate:Normal     Neuro/Psych    GI/Hepatic   Endo/Other  diabetes    Renal/GU      Musculoskeletal   Abdominal   Peds  Hematology   Anesthesia Other Findings   Reproductive/Obstetrics                             Anesthesia Physical Anesthesia Plan  ASA: 6  Anesthesia Plan: General   Post-op Pain Management:    Induction: Intravenous  PONV Risk Score and Plan: 2  Airway Management Planned:   Additional Equipment:   Intra-op Plan:   Post-operative Plan:   Informed Consent:   Plan Discussed with: CRNA and Anesthesiologist  Anesthesia Plan Comments:        Anesthesia Quick Evaluation

## 2022-03-12 NOTE — Anesthesia Postprocedure Evaluation (Signed)
Anesthesia Post Note  Patient: Lance Crosby  Procedure(s) Performed: ORGAN PROCUREMENT LIVER & KIDNEY     Patient location during evaluation: Other (OR) Anesthesia Type: General Post-procedure mental status: deceased. Anesthetic complications: no Comments: Pt deceased.   No notable events documented.  Last Vitals:  Vitals:   28-Mar-2022 1415 March 28, 2022 1430  BP:    Pulse: 79 85  Resp: (!) 32 (!) 32  Temp:  36.8 C  SpO2: 97% 96%    Last Pain:  Vitals:   Mar 28, 2022 1215  TempSrc:   PainSc: 0-No pain                 Mervil Wacker S

## 2022-03-13 LAB — CULTURE, RESPIRATORY W GRAM STAIN

## 2022-03-14 LAB — CULTURE, BLOOD (ROUTINE X 2)
Culture: NO GROWTH
Culture: NO GROWTH
Special Requests: ADEQUATE
Special Requests: ADEQUATE

## 2022-03-16 NOTE — Progress Notes (Signed)
[facility] Progress Note Patient Name: Lance Crosby DOB: Dec 22, 1945 MRN: [id number]   Date of Service  02/28/2022  HPI/Events of Note  Nursing question about continuing NaHCO3 IV infusion. Will need to see ABG result prior to decision on NaHCO3 IV infusion.   eICU Interventions  Plan: ABG now.      Intervention Category Major Interventions: Acid-Base disturbance - evaluation and management;Respiratory failure - evaluation and management  Lysle Dingwall 02/18/2022, 2:48 AM

## 2022-03-16 NOTE — Progress Notes (Signed)
[facility] referral # [id number]. Notified them of declaration of brain death. Spoke with coordinator who will assess patient for donor eligibility.

## 2022-03-16 NOTE — Progress Notes (Signed)
MRI brain was done which is consistent with severe hypoxic/anoxic brain injury, cerebral/cerebellar edema and brain compression with herniation syndrome  Patient's family was updated at bedside, will perform brain dead and apnea testing later today.     Jacky Kindle, MD [place] [facility] See Amion for pager If no response to pager, please call [phone] until 7pm After 7pm, Please call E-link [phone]

## 2022-03-16 NOTE — Progress Notes (Signed)
Performed Apnea test with MD at bedside. Under his direction, pre-ABG was obtained with pt on ventilator. Pt then taken off ventilator and 8L oxygen through O2 tubing was placed down the et tube and a timer was started for 8 minutes. At the completion of the 8 minutes and the direction of the MD, a post ABG was obtained. Following the results, the patient was placed back on the ventilator on his previous settings.

## 2022-03-16 NOTE — Progress Notes (Signed)
eLink Physician-Brief Progress Note Patient Name: Lance Crosby DOB: January 23, 1946 MRN: [id number]   Date of Service  03/23/22  HPI/Events of Note  Received clarification regarding code status, currently full code Patient was declared brain dead 4:32 pm Mar 23, 2022 Dr Tacy Learn had discussed with bedside RN that no intervention will be done in the event that patient goes into cardiac arrest.  eICU Interventions  Changed code status to DNR     Intervention Category Minor Interventions: Communication with other healthcare providers and/or family  Judd Lien 03/23/2022, 7:51 PM

## 2022-03-16 NOTE — Progress Notes (Signed)
[facility] ADULT ICU REPLACEMENT PROTOCOL   The patient does apply for the [facility] Adult ICU Electrolyte Replacment Protocol based on the criteria listed below:   1.Exclusion criteria: TCTS, ECMO, Dialysis, and Myasthenia Gravis patients 2. Is GFR >/= 30 ml/min? Yes.    Patient's GFR today is >60 3. Is SCr </= 2? Yes.   Patient's SCr is 1.2 mg/dL 4. Did SCr increase >/= 0.5 in 24 hours? No. 5.Pt's weight >40kg  Yes.   6. Abnormal electrolyte(s):   K 3.2, Mg 1.8 (Mg repletion already ordered on MAR)   7. Electrolytes replaced per protocol 8.  Call MD STAT for K+ </= 2.5, Phos </= 1, or Mag </= 1 Physician:  S. Rosie Fate R Moana Munford 02/23/2022 5:24 AM

## 2022-03-16 NOTE — Procedures (Signed)
Adult Brain Death Determination  Time of Examination: 02/20/2022 4:36 PM  No Evidence of /Cause of Reversible CNS Depression  Core temperature must be greater >36 degrees. Last temp: Temp: 98.6 F (37 C) (Note: If unable to achieve normothermia after 12 hours of temperature management, may consider proceeding with Brain Death Evaluation.):    yes  Evidence of severe metabolic perturbations that could potentate CNS depression. Consider glucose, Na, creatinine, PaCO2, SaO2.:    Absent  Evidence of drugs, by history or measurement, that could potentiate central nervous system depression: narcotics, ethanol, benzodiazepines, barbiturates, neuromuscular blockade.:     Absent  Absence of Cortical Function  GCS = 3:    yes  Absence of Brain Stem Reflexes and Responses  Pupils light-fixed    yes  Corneal reflexes:    Absent  Response to upper and lower airway stimulation, such as pharyngeal and endotracheal suctioning.:    Absent  Ocular response to head turning (eye movement).    Absent  Absence of Spontaneous Respirations  (Apnea test performed per Brain Death Policy. If not met due to hemodynamic/ventilatory instability, then perform EEG, TCD, or cerebral blow flow studies.)  1.   Spontaneous Respirations   Absent  2.   PaCO2 at start of apnea test:  38  3.   PaCO2 at end of apnea test:  67  4.   CO2 rise of 20 or greater from baseline:   yes  Document Confirmatory Test Utilized: (Optional) Nuclear cerebral flow, cerebral angiography (CT/MR angio), transcranial Doppler ultrasound, EEG, SSEP (record results).  Test results (if available):  Consistent with brain dead  Patient pronounced dead by neurological criteria at 4:32 PM on 02/25/2022.  Jacky Kindle, MD 02/28/2022 4:36 PM

## 2022-03-16 NOTE — Progress Notes (Signed)
   02/15/2022 1652  Clinical Encounter Type  Visited With Family  Visit Type Death;Spiritual support  Referral From Nurse (Christy R. Zigmund Daniel, RN)  Consult/Referral To Chaplain Melvenia Beam)  Recommendations Death  Spiritual Encounters  Spiritual Needs Emotional;Grief support;Prayer   1720:  Chaplain responded to Lance Crosby to provide emotional support to the family of Rev. Otila Back. Kanaan who died at 07/21/1630. Family was gathered at patient's bedside and we shared in intercessory prayer for God to receive him into His [place] and for comfort and reassurance in their grief. [address] [place], [place] [phone]

## 2022-03-16 NOTE — Progress Notes (Signed)
NAME:  Lance Crosby, MRN:  [id number], DOB:  Oct 05, 1945, LOS: 2 ADMISSION DATE:  03/13/2022, CONSULTATION DATE:  02/17/2022 REFERRING MD:  EDP, CHIEF COMPLAINT:  PEA arrest   History of Present Illness:  Lance Crosby is a 76 y.o. M with PMH of colon Ca, DM, GERD, HTN, arthritis with recent ED visit for bee sting on prednisone taper who was brought in by EMS unresponsive after his wife heard him fall out of bed.  He had been complaining of generalized weakness for two days prior.   He was found in PEA arrest, (seems to have been at least 10 mins before chest compressions started) initially coded 15-20 minutes then an additional 2 minutes.  He was intubated without medication and brought to the ED where he required pacing initially then epinephrine gtt.    CT head/C-spine and xray pelvis significant for L temporal lobe gray/white matter in the L temporal lobe, type 2 dens fracture with retropulsion 2-28m, likely pulmonary edema and RUL opacity.   Labs significant for Glucose >700, CMP pending, lactic acid >9, WBC 17k, creatinine 1.3.  PCCM consulted for admission.  Pertinent  Medical History   has a past medical history of Arthritis, Claustrophobia, [place], GERD (gastroesophageal reflux disease), and Hypertension.   Significant Hospital Events: Including procedures, antibiotic start and stop dates in addition to other pertinent events   11/21 PEA arrest, intubated, on epi gtt, PCCM admit  Interim History / Subjective:  Remains unresponsive on full mechanical ventilatory support Still requiring vasopressor support Remains on [place] sun with water temperature 32 degrees   Objective   Blood pressure 129/87, pulse 82, temperature 98.6 F (37 C), resp. rate (!) 32, height '5\' 9"'$  (1.753 m), weight 85.9 kg, SpO2 93 %. CVP:  [4 mmHg-10 mmHg] 7 mmHg  Vent Mode: PRVC FiO2 (%):  [70 %-80 %] 80 % Set Rate:  [32 bmp] 32 bmp Vt Set:   [560 mL] 560 mL PEEP:  [12 cmH20] 12 cmH20 Plateau Pressure:  [20 cmH20-30 cmH20] 30 cmH20   Intake/Output Summary (Last 24 hours) at 03/11/2022 [id number] data filed at 03/12/2022 0700 Gross per 24 hour  Intake 4181.63 ml  Output 2135 ml  Net 2046.63 ml   Filed Weights   02/24/2022 1851 03/07/22 0424 03/12/2022 0451  Weight: 99.8 kg 82.8 kg 85.9 kg     Physical exam: General: Crtitically ill-appearing male, orally intubated HEENT: Superficial laceration noted on forehead, AT, eyes anicteric.  ETT and OGT in place Neuro: Eyes closed, does not open, not following commands.  Pupils dilated and fixed, absent corneals, cough, gag or response to painful stimuli Chest: Coarse breath sounds, no wheezes or rhonchi Heart: Regular rate and rhythm, no murmurs or gallops Abdomen: Soft, nontender, nondistended, bowel sounds present Skin: Superficial abrasions noted to the face  Resolved Hospital Problem list     Assessment & Plan:  Out of hospital PEA cardiac arrest, likely in the setting of electrolyte abnormalities Hypokalemia/hypocalcemia Post arrest anoxic encephalopathy Acute hypoxic/hypercapnic respiratory failure Septic shock with ARDS due to aspiration pneumonia Type II Dens fracture Recent admit for bee sting New diagnosis of DM2 with hyperglycemia likely in the setting of steroid use Acute kidney injury due to ischemic ATN in the setting of cardiac arrest Shock liver Acute metabolic acidosis/lactic acidosis  15 to 20 minutes of CPR before ROSC achieved EKG showed nonischemic changes Likely in the setting of electrolyte abnormalities and metabolic derangement as patient  blood sugar was 700 upon presentation and he was hypokalemic Initially required pacing likely due to acidosis, converted to sinus rhythm once bicarbonate was given Continue normothermia protocol for 48 hours before slow rewarming Continue aggressive electrolyte supplement Continue telemetry monitoring CT head is  showing loss of gray-white differentiation, early signs of anoxic brain injury He remained off sedation, his neurological exam is poor Will get MRI brain today Continue lung protective ventilation Setting was adjusted, hypercapnia has cleared VAP bundle Continue IV vasopressor support with map goal 65, currently on norepinephrine and vasopressin Will stop vasopressin now Continue stress dose steroid Continue IV antibiotics Follow-up cultures Appreciate neurosurgery consultation, recommend hard neck collar and watchful waiting Continue insulin infusion for now, blood sugars are better controlled MRI cervical spine is ordered He presented with blood sugar of 700 Now blood sugars are better controlled His hemoglobin A1c is 8.8 Monitor intake and output, avoid nephrotoxic agents GERD bicarbonate infusion was stopped LFTs and serum creatinine started improving He is making good amount of urine Avoid nephrotoxic agents   Best Practice (right click and "Reselect all SmartList Selections" daily)   Diet/type: NPO tube feeds DVT prophylaxis: prophylactic heparin  GI prophylaxis: PPI Lines: Central line Foley:  Yes, and it is still needed Code Status:  full code Last date of multidisciplinary goals of care discussion [11/23: Updated patient's daughter and sister at bedside, decided to continue full scope of care while keeping him full code  Labs   CBC: Recent Labs  Lab 02/21/2022 1856 02/26/2022 1857 02/18/2022 2155 03/05/2022 2159 03/07/22 0214 03/07/22 0328 03/07/22 0538 03/07/22 1211 02/20/2022 0307 03/04/2022 0359  WBC 17.4*  --  5.4  --  4.5  --   --   --   --  13.9*  NEUTROABS 10.0*  --   --   --   --   --   --   --   --   --   HGB 12.3*   < > 14.7   < > 13.6 13.3 13.9 14.3 13.3 14.3  HCT 38.8*   < > 43.6   < > 38.2* 39.0 41.0 42.0 39.0 40.2  MCV 95.8  --  89.5  --  84.5  --   --   --   --  83.9  PLT 178  --  233  --  205  --   --   --   --  115*   < > = values in this interval  not displayed.    Basic Metabolic Panel: Recent Labs  Lab 03/05/2022 1856 02/18/2022 1857 03/12/2022 2036 03/07/22 0214 03/07/22 0328 03/07/22 0538 03/07/22 0805 03/07/22 1211 03/07/22 1334 02/21/2022 0307 02/27/2022 0359  NA 138 135   < > 149*   < > 146* 145 142  --  140 141  K 4.1 4.4   < > 2.7*   < > 3.0* 3.1* 3.8 3.8 3.2* 3.2*  CL 100 102  --  111  --   --  106  --   --   --  98  CO2 16*  --   --  29  --   --  21*  --   --   --  25  GLUCOSE 751* >700*  --  253*  --   --  138*  --   --   --  150*  BUN 18 19  --  20  --   --  19  --   --   --  13  CREATININE 1.80* 1.30*  --  1.72*  --   --  1.64*  --   --   --  1.20  CALCIUM 11.4*  --   --  8.0*  --   --  8.4*  --   --   --  8.4*  MG  --   --   --  1.6*  --   --   --   --   --   --  1.8  PHOS  --   --   --  4.1  --   --   --   --   --   --  3.8   < > = values in this interval not displayed.   GFR: Estimated Creatinine Clearance: 56.9 mL/min (by C-G formula based on SCr of 1.2 mg/dL). Recent Labs  Lab 02/20/2022 1856 02/16/2022 2115 02/18/2022 2155 03/07/22 0214 03/07/22 0224 03/07/22 0905 03/07/22 1146 02/17/2022 0359  WBC 17.4*  --  5.4 4.5  --   --   --  13.9*  LATICACIDVEN >9.0* 7.5*  --   --  4.7* 5.3* 6.4*  --     Liver Function Tests: Recent Labs  Lab 03/15/2022 1856 03/07/22 0214 02/17/2022 0359  AST 305* 204* 86*  ALT 268* 226* 148*  ALKPHOS 145* 93 64  BILITOT 0.7 0.5 1.3*  PROT 5.1* 4.5* 5.2*  ALBUMIN 2.8* 2.4* 2.4*   No results for input(s): "LIPASE", "AMYLASE" in the last 168 hours. No results for input(s): "AMMONIA" in the last 168 hours.  ABG    Component Value Date/Time   PHART 7.478 (H) 03/14/2022 0307   PCO2ART 38.3 02/23/2022 0307   PO2ART 79 (L) 03/03/2022 0307   HCO3 28.4 (H) 03/05/2022 0307   TCO2 30 02/20/2022 0307   ACIDBASEDEF 3.0 (H) 03/07/2022 1211   O2SAT 96 03/12/2022 0307     Coagulation Profile: No results for input(s): "INR", "PROTIME" in the last 168 hours.  Cardiac Enzymes: No  results for input(s): "CKTOTAL", "CKMB", "CKMBINDEX", "TROPONINI" in the last 168 hours.  HbA1C: Hgb A1c MFr Bld  Date/Time Value Ref Range Status  06/08/2019 03:05 PM 8.8 (H) 4.8 - 5.6 % Final    Comment:    (NOTE) Pre diabetes:          5.7%-6.4% Diabetes:              >6.4% Glycemic control for   <7.0% adults with diabetes     CBG: Recent Labs  Lab 03/07/22 1945 03/07/22 2133 03/07/22 2342 02/19/2022 0221 03/11/2022 0700  GLUCAP 158* 146* 165* 141* 166*    Total critical care time: 42 minutes  Performed by: [place] care time was exclusive of separately billable procedures and treating other patients.   Critical care was necessary to treat or prevent imminent or life-threatening deterioration.   Critical care was time spent personally by me on the following activities: development of treatment plan with patient and/or surrogate as well as nursing, discussions with consultants, evaluation of patient's response to treatment, examination of patient, obtaining history from patient or surrogate, ordering and performing treatments and interventions, ordering and review of laboratory studies, ordering and review of radiographic studies, pulse oximetry and re-evaluation of patient's condition.   Jacky Kindle, MD [facility] See Amion for pager If no response to pager, please call [phone] until 7pm After 7pm, Please call E-link [phone]

## 2022-03-16 NOTE — Death Summary Note (Signed)
DEATH SUMMARY   Patient Details  Name: Lance Crosby MRN: [id number] DOB: 12-25-1945  Admission/Discharge Information   Admit Date:  March 14, 2022  Date of Death: Date of Death: March 16, 2022  Time of Death: July 05, 1630  Length of Stay: 4  Referring Physician: Kristie Cowman, MD   Reason(s) for Hospitalization  Out of hospital PEA cardiac arrest, likely in the setting of electrolyte abnormalities Hypokalemia/hypocalcemia/hyponatremia Post arrest anoxic encephalopathy Acute hypoxic/hypercapnic respiratory failure Septic shock with ARDS due to aspiration pneumonia Type II Dens vertebral fracture Recent admit for bee sting New diagnosis of DM2 with hyperglycemia likely in the setting of steroid use Acute kidney injury due to ischemic ATN in the setting of cardiac arrest Shock liver Acute metabolic acidosis/lactic acidosis  Diagnoses  Preliminary cause of death: Withdrawal of care in the setting of severe anoxic injury postcardiac arrest Secondary Diagnoses (including complications and co-morbidities):  Principal Problem:   Cardiac arrest [facility])   Brief Hospital Course (including significant findings, care, treatment, and services provided and events leading to death)  Lance Crosby is a 76 y.o. year old male with PMH of colon Ca, DM, GERD, HTN, arthritis with recent ED visit for bee sting on prednisone taper who was brought in by EMS unresponsive after his wife heard him fall out of bed.  He had been complaining of generalized weakness for two days prior.   He was found in PEA arrest, (seems to have been at least 10 mins before chest compressions started) initially coded 15-20 minutes then an additional 2 minutes.  He was intubated without medication and brought to the ED where he required pacing initially then epinephrine gtt.     CT head/C-spine and xray pelvis significant for L temporal lobe gray/white matter in the L temporal lobe, type 2 dens fracture with retropulsion 2-63m, likely  pulmonary edema and RUL opacity.   Labs significant for Glucose >700, CMP pending, lactic acid >9, WBC 17k, creatinine 1.3.  PCCM consulted for admission.  Patient was admitted to ICU, unfortunately his neuroexam remained poor with absent pupillary, corneal, cough and gag reflexes.  He was continued on normothermia protocol, he was requiring vasopressor support and continued on full support mechanical ventilation.  He was continued on IV antibiotics for aspiration pneumonia/ARDS.  He had new diagnosis of diabetes with hyperglycemia, initially was started on insulin infusion, then it was transitioned to long-acting insulin and sliding scale.  Patient did have type II dens vertebral fracture, neurosurgery was consulted, they recommend watchful waiting considering poor neurological exam.  Patient had MRI brain done which was consistent with severe anoxic brain injury.  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  [place] discussion with [place] they agreed to proceed with organ procurement.  Patient remained in ICU on full mechanical ventilatory support and vasopressor support while he was being worked up for oTeacher, early years/pre  He was deemed candidate for organ donation, he was taken to the OR on 112-03-23at 3 PM, patient's family was at bedside.  Pertinent Labs and Studies  Significant Diagnostic Studies DG CHEST PORT 1 VIEW  Result Date: 103-Dec-2023CLINICAL DATA:  Cardiac arrest. EXAM: PORTABLE CHEST 1 VIEW COMPARISON:  March 09, 2022. FINDINGS: The heart size and mediastinal contours are within normal limits. Endotracheal and nasogastric tubes are in grossly good position. Stable bilateral lung opacities are noted concerning for edema or infiltrates. The visualized skeletal structures are unremarkable.  IMPRESSION: Support apparatus in grossly good position. Stable bibasilar opacities.  Electronically Signed   By: Marijo Conception M.D.   On: 2022-03-29 08:18   CT CHEST ABDOMEN PELVIS WO CONTRAST  Result Date: 03/09/2022 CLINICAL DATA:  Organ donor. EXAM: CT CHEST, ABDOMEN AND PELVIS WITHOUT CONTRAST TECHNIQUE: Multidetector CT imaging of the chest, abdomen and pelvis was performed following the standard protocol without IV contrast. RADIATION DOSE REDUCTION: This exam was performed according to the departmental dose-optimization program which includes automated exposure control, adjustment of the mA and/or kV according to patient size and/or use of iterative reconstruction technique. COMPARISON:  Abdomen and pelvis CT 04/28/2012 FINDINGS: CT CHEST FINDINGS Cardiovascular: The heart size is normal. No substantial pericardial effusion. Coronary artery calcification is evident. Mild atherosclerotic calcification is noted in the wall of the thoracic aorta. Mediastinum/Nodes: Endotracheal and NG tubes evident. No mediastinal lymphadenopathy. No evidence for gross hilar lymphadenopathy although assessment is limited by the lack of intravenous contrast on the current study. The esophagus has normal imaging features. There is no axillary lymphadenopathy. Lungs/Pleura: There is bilateral, diffuse but central predominant patchy ground-glass opacity is seen in the lungs with more consolidative airspace disease in the lower lobes bilaterally, right greater than left. Small bilateral pleural effusions noted. Musculoskeletal: No worrisome lytic or sclerotic osseous abnormality. CT ABDOMEN PELVIS FINDINGS Hepatobiliary: No suspicious focal abnormality in the liver on this study without intravenous contrast. Layering sludge noted in the gallbladder. No intrahepatic or extrahepatic biliary dilation. Pancreas: No focal mass lesion. No dilatation of the main duct. No intraparenchymal cyst. No peripancreatic edema. Spleen: No splenomegaly. No focal mass lesion. Adrenals/Urinary Tract: No adrenal nodule or mass.  Kidneys unremarkable. No evidence for hydroureter. Bladder is decompressed by Foley catheter. Although poorly evaluated due to decompressed state and lack of contrast material, there may be minimal circumferential bladder wall thickening. Stomach/Bowel: Stomach is decompressed. NG tube tip is in the antrum. Duodenum is normally positioned as is the ligament of Treitz. No small bowel wall thickening. No small bowel dilatation. The terminal ileum is normal. The appendix is normal. Colon is diffusely decompressed with anastomotic suture line in the sigmoid segment. Rectal temperature probe noted. Vascular/Lymphatic: There is mild atherosclerotic calcification of the abdominal aorta without aneurysm. There is no gastrohepatic or hepatoduodenal ligament lymphadenopathy. No retroperitoneal or mesenteric lymphadenopathy. No pelvic sidewall lymphadenopathy. Right femoral venous catheter evident with tip position in the proximal right external iliac vein. Reproductive: Prostate gland is enlarged. Other: No intraperitoneal free fluid. Musculoskeletal: Small bilateral groin hernias contain only fat. No worrisome lytic or sclerotic osseous abnormality. IMPRESSION: 1. Bilateral, diffuse but central predominant patchy ground-glass opacity in the lungs with more consolidative airspace disease in the lower lobes bilaterally, right greater than left. Imaging features are nonspecific and may be related to pulmonary edema or infection/inflammation. The more consolidative opacity in the right base is more suggestive of infectious/inflammatory etiology in aspiration is not excluded. 2. Small bilateral pleural effusions. 3. Layering sludge in the gallbladder. 4. Prostatomegaly. 5. Small bilateral groin hernias contain only fat. 6.  Aortic Atherosclerosis (ICD10-I70.0). Electronically Signed   By: Misty Stanley M.D.   On: 03/09/2022 18:26   US BIOPSY (LIVER)  Result Date: 03/09/2022 INDICATION: Organ donation EXAM: ULTRASOUND GUIDED  DONOR LIVER BIOPSY COMPARISON:  CT AP, 04/28/2012. MEDICATIONS: None ANESTHESIA/SEDATION: Local anesthetic was administered. The patient was continuously monitored during the procedure by the ICU nurse under my direct supervision. COMPLICATIONS: None immediate. PROCEDURE: Informed written consent was obtained from the  patient and/or patient's representative after a discussion of the risks, benefits and alternatives to treatment. The patient understands and consents the procedure. A timeout was performed prior to the initiation of the procedure. Ultrasound scanning was performed of the epigastrium and right upper abdominal quadrant and the procedure was planned. The epigastrium was prepped and draped in the usual sterile fashion. A 17 gauge, 6.8 cm co-axial needle was advanced into the LEFT lobe of the liver and 3 core biopsies were obtained with an 18 gauge core device under direct ultrasound guidance. The co-axial needle track was embolized with the administration of a Gel-Foam slurry. Superficial hemostasis was obtained with manual compression. Post procedural scanning was negative for definitive area of hemorrhage. A dressing was placed. The patient tolerated the procedure well without immediate post procedural complication. IMPRESSION: Successful ultrasound guided non targeted donor liver biopsy. Michaelle Birks, MD [facility] Electronically Signed   By: Michaelle Birks M.D.   On: 03/09/2022 15:44   DG CHEST PORT 1 VIEW  Result Date: 03/09/2022 CLINICAL DATA:  Organ donor. EXAM: PORTABLE CHEST 1 VIEW COMPARISON:  Chest x-ray 03/07/2022 FINDINGS: The endotracheal tube and NG tubes are stable. Persistent but slightly improved bilateral perihilar airspace process likely noncardiogenic pulmonary edema. No pleural effusions. No pneumothorax. IMPRESSION: Persistent but slightly improved bilateral airspace process. Stable support apparatus. Electronically Signed    By: Marijo Sanes M.D.   On: 03/09/2022 11:51   MR BRAIN WO CONTRAST  Result Date: 02/20/2022 CLINICAL DATA:  Provided history: Neuro deficit, acute, stroke suspected. Additional history provided: Status post PEA arrest. EXAM: MRI HEAD WITHOUT CONTRAST TECHNIQUE: Multiplanar, multiecho pulse sequences of the brain and surrounding structures were obtained without intravenous contrast. COMPARISON:  Head CT 03/02/2022. FINDINGS: Brain: There is diffuse diffusion-weighted and T2 FLAIR hyperintense signal abnormality throughout the bilateral cerebral cortex, deep gray nuclei, hippocampi and throughout the cerebellum. There is marked cerebral and cerebellar edema. There is complete effacement of the basal cisterns and near complete effacement of the posterior fossa CSF spaces. The cerebellar tonsils have herniated 3.4 cm below the level of foramen magnum. There is significant mass effect upon the brainstem and upper cervical spinal cord. No evidence of an intracranial mass. No chronic intracranial blood products. No extra-axial fluid collection. No midline shift. Vascular: Signal abnormality within the intracranial internal carotid arteries bilaterally, likely reflecting slow flow in the setting of elevated intracranial pressure. Additionally, there is prominence of the weighted signal loss within the intracranial vasculature, also compatible with slow flow in the setting of elevated intracranial pressure. Skull and upper cervical spine: No focal suspicious marrow lesion. Sinuses/Orbits: No mass or acute finding within the imaged orbits. Prior bilateral ocular lens replacement. Pansinusitis. Other: Small-volume fluid within the bilateral mastoid air cells. These results were called by telephone at the time of interpretation on 02/16/2022 at 2:36 pm to provider Dr. Bridgett Larsson, who verbally acknowledged these results. IMPRESSION: Findings compatible with extensive acute hypoxic/ischemic injury affecting the supratentorial  and infratentorial brain, as described. There is marked cerebral and cerebellar edema. The basal cisterns are completely effaced, and there is near complete effacement of the posterior fossa CSF spaces. The cerebellar tonsils have herniated below the level of foramen magnum by 3.4 cm. There is significant mass effect upon the brainstem and upper cervical spinal cord. Electronically Signed   By: Kellie Simmering D.O.   On: 02/19/2022 14:40   MR CERVICAL SPINE WO CONTRAST  Result Date: 02/23/2022 CLINICAL DATA:  [place].  C2 fracture. Unresponsive. EXAM: MRI CERVICAL SPINE WITHOUT CONTRAST TECHNIQUE: Multiplanar, multisequence MR imaging of the cervical spine was performed. No intravenous contrast was administered. COMPARISON:  Head CT 02/25/2022 and brain MRI today. FINDINGS: Alignment: Normal Vertebrae: Stable C2 fracture. Cord: The upper cervical cord is infarcted due to massive cerebellar herniation compressing the cord and likely cutting off the blood supply. Posterior Fossa, vertebral arteries, paraspinal tissues: Massive tonsillar herniation due to cerebral and cerebellar edema. Disc levels: Epidural hematoma noted beginning at C4 and extending down to T2. IMPRESSION: 1. Stable C2 fracture. 2. Massive tonsillar herniation due to cerebral and cerebellar edema. 3. 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 Electronically Signed   By: Marijo Sanes M.D.   On: 03/09/2022 14:38   ECHOCARDIOGRAM COMPLETE  Result Date: 03/07/2022    ECHOCARDIOGRAM REPORT   Patient Name:   Lance Crosby Date of Exam: 03/07/2022 Medical Rec #:  [id number]        Height:       69.0 in Accession #:    [id number]       Weight:       182.5 lb Date of Birth:  27-Dec-1945        BSA:          1.987 m Patient Age:    76 years         BP:           139/96 mmHg Patient Gender: M                HR:           100 bpm. Exam Location:  Inpatient Procedure: 2D Echo, Cardiac Doppler and Color Doppler Indications:    Cardiac arrest  I46.9  History:        Patient has no prior history of Echocardiogram examinations.                 Risk Factors:Diabetes and Hypertension.  Sonographer:    Ronny Flurry Referring Phys: [id number] Candee Furbish  Sonographer Comments: Suboptimal apical window and Technically difficult study due to poor echo windows. IMPRESSIONS  1. Left ventricular ejection fraction, by estimation, is 70 to 75%. The left ventricle has hyperdynamic function. Left ventricular endocardial border not optimally defined to evaluate regional wall motion. Left ventricular diastolic parameters are indeterminate.  2. This study is technically difficult. Function is described largely from img 82 and subcostal series.  3. Right ventricular systolic function is hyperdynamic. The right ventricular size is normal.  4. The mitral valve was not well visualized. No evidence of mitral valve regurgitation.  5. The aortic valve is tricuspid. There is mild calcification of the aortic valve. Aortic valve regurgitation is not visualized. Aortic valve sclerosis/calcification is present, without any evidence of aortic stenosis.  6. The inferior vena cava is normal in size with <50% respiratory variability, suggesting right atrial pressure of 8 mmHg. Comparison(s): No prior Echocardiogram. FINDINGS  Left Ventricle: Left ventricular ejection fraction, by estimation, is 70 to 75%. The left ventricle has hyperdynamic function. Left ventricular endocardial border not optimally defined to evaluate regional wall motion. The left ventricular internal cavity size was small. There is no left ventricular hypertrophy. Left ventricular diastolic parameters are indeterminate. Right Ventricle: The right ventricular size is normal. No increase in right ventricular wall thickness. Right ventricular systolic function is hyperdynamic. Left Atrium: Left atrial size was not well visualized. Right Atrium: Right atrial size was not well visualized. Pericardium: There is no  evidence of pericardial effusion. Mitral Valve: The mitral valve was not well visualized. No evidence of mitral valve regurgitation. Tricuspid Valve: The tricuspid valve is not well visualized. Tricuspid valve regurgitation is not demonstrated. No evidence of tricuspid stenosis. Aortic Valve: The aortic valve is tricuspid. There is mild calcification of the aortic valve. Aortic valve regurgitation is not visualized. Aortic valve sclerosis/calcification is present, without any evidence of aortic stenosis. Pulmonic Valve: The pulmonic valve was not well visualized. Pulmonic valve regurgitation is trivial. No evidence of pulmonic stenosis. Aorta: The aortic root and ascending aorta are structurally normal, with no evidence of dilitation. Venous: The inferior vena cava is normal in size with less than 50% respiratory variability, suggesting right atrial pressure of 8 mmHg. IAS/Shunts: The interatrial septum was not well visualized.  LEFT VENTRICLE PLAX 2D LVIDd:         3.80 cm LVIDs:         3.25 cm LV PW:         0.90 cm LV IVS:        0.80 cm LVOT diam:     1.90 cm LVOT Area:     2.84 cm  RIGHT VENTRICLE         IVC TAPSE (M-mode): 1.3 cm  IVC diam: 2.00 cm LEFT ATRIUM         Index LA diam:    2.30 cm 1.16 cm/m   AORTA Ao Root diam: 3.30 cm Ao Asc diam:  3.30 cm  SHUNTS Systemic Diam: 1.90 cm Rudean Haskell MD Electronically signed by Rudean Haskell MD Signature Date/Time: 03/07/2022/1:10:41 PM    Final    EEG adult  Result Date: 03/07/2022 Lora Havens, MD     03/07/2022  8:44 AM Patient Name: Lance Crosby MRN: [id number] Epilepsy Attending: Lora Havens Referring Physician/Provider: Candee Furbish, MD Date: 03/11/2022 Duration: 22.39 mins Patient history: 76 year old male status post cardiac arrest.  EEG to evaluate for seizure. Level of alertness:  comatose AEDs during EEG study: None Technical aspects: This EEG study was done with scalp electrodes positioned according to the 10-20  International system of electrode placement. Electrical activity was reviewed with band pass filter of 1-'70Hz'$ , sensitivity of 7 uV/mm, display speed of 86m/sec with a '60Hz'$  notched filter applied as appropriate. EEG data were recorded continuously and digitally stored.  Video monitoring was available and reviewed as appropriate. Description: EEG showed continuous generalized pattern suppression.  EEG was not reactive to tactile stimulation.  Hyperventilation and photic stimulation were not performed.   ABNORMALITY - Background suppression generalized IMPRESSION: This study is suggestive of profound diffuse encephalopathy, nonspecific etiology. No seizures or epileptiform discharges were seen throughout the recording. PLora Havens  DG Chest Port 1 View  Result Date: 03/07/2022 CLINICAL DATA:  Adult respiratory distress syndrome. EXAM: PORTABLE CHEST 1 VIEW COMPARISON:  02/23/2022. FINDINGS: The heart size and mediastinal contours are within normal limits. There is atherosclerotic calcification of the aorta. Patchy perihilar airspace disease is noted bilaterally. No effusion or pneumothorax. The endotracheal tube terminates 3.1 cm above the carina. An enteric tube courses over the stomach and out of the field of view. IMPRESSION: 1. Patchy perihilar airspace disease bilaterally, possible edema or infiltrate. 2. Support apparatus as described above. Electronically Signed   By: LBrett FairyM.D.   On: 03/07/2022 02:47   DG Abd 1 View  Result Date: 03/03/2022 CLINICAL DATA:  Nasogastric tube placement. EXAM: ABDOMEN - 1 VIEW COMPARISON:  None Available. FINDINGS:  Tip and side port of the enteric tube below the diaphragm in the stomach. There is a right femoral catheter with tip in the expected location of the right common iliac vessels. Generalized paucity of bowel gas in the abdomen. IMPRESSION: Tip and side port of the enteric tube below the diaphragm in the stomach. Right femoral catheter with tip in  the region of the right common iliac vessels. Electronically Signed   By: Keith Rake M.D.   On: 03/09/2022 21:39   DG Hip Unilat W or Wo Pelvis 2-3 Views Left  Result Date: 03/14/2022 CLINICAL DATA:  Status post fall. EXAM: DG HIP (WITH OR WITHOUT PELVIS) 2-3V LEFT COMPARISON:  None Available. FINDINGS: Technically limited due to positioning. No evidence of fracture or dislocation. Left hip degenerative change with joint space narrowing and acetabular spurring. Pubic rami are grossly intact. IMPRESSION: No fracture or dislocation of the left hip. Left hip degenerative change. Electronically Signed   By: Keith Rake M.D.   On: 02/19/2022 21:37   DG Pelvis Portable  Result Date: 02/14/2022 CLINICAL DATA:  fall EXAM: PORTABLE PELVIS 1-2 VIEWS COMPARISON:  X-ray pelvis 03/22/2014 FINDINGS: Limited evaluation due to overlapping osseous structures and overlying soft tissues. There is no evidence of pelvic fracture or diastasis. No acute displaced fracture or dislocation of the visualized hips on frontal view. Lower lumbar spine demonstrates degenerative changes. No pelvic bone lesions are seen. IMPRESSION: Negative for acute traumatic injury. Limited evaluation due to overlapping osseous structures and overlying soft tissues. Electronically Signed   By: Iven Finn M.D.   On: 02/28/2022 20:02   CT Cervical Spine Wo Contrast  Result Date: 02/25/2022 CLINICAL DATA:  Neck trauma, intoxicated or obtunded (Age >= 16y) EXAM: CT CERVICAL SPINE WITHOUT CONTRAST TECHNIQUE: Multidetector CT imaging of the cervical spine was performed without intravenous contrast. Multiplanar CT image reconstructions were also generated. RADIATION DOSE REDUCTION: This exam was performed according to the departmental dose-optimization program which includes automated exposure control, adjustment of the mA and/or kV according to patient size and/or use of iterative reconstruction technique. COMPARISON:  None Available.  FINDINGS: Alignment: Broad-based reversal of normal lordosis. Skull base and vertebrae: Oblique mildly displaced type 2 dens fracture. There is posterior retropulsion of approximately 2-3 mm. No posterior element involvement. No additional fracture. Soft tissues and spinal canal: No significant canal hematoma related to dens fracture. Disc levels: Disc space narrowing and spurring C4-C5, C5-C6 and C6-C7. multilevel facet hypertrophy. Upper chest: Dependent consolidation in the right upper lobe. Additional patchy ground-glass opacities within both upper lobes. Slight septal thickening. Other: Enteric tube appears to be looped in the mouth, although incompletely included in the field of view. IMPRESSION: 1. Oblique mildly displaced type 2 dens fracture with posterior retropulsion of approximately 2-3 mm. 2. Multilevel degenerative disc disease and facet hypertrophy. 3. Enteric tube appears to be looped in the mouth, although incompletely included in the field of view. 4. Dependent right upper lobe opacity may represent aspiration. Additional patchy ground-glass opacities within both upper lobes. Septal thickening suggest pulmonary edema. These results were called by telephone at the time of interpretation on 03/02/2022 at 7:58 pm to provider Davonna Belling , who verbally acknowledged these results. Electronically Signed   By: Keith Rake M.D.   On: 02/18/2022 19:59   CT HEAD WO CONTRAST (5MM)  Result Date: 02/21/2022 CLINICAL DATA:  Head trauma, moderate-severe Fall out of bed. Post CPR. EXAM: CT HEAD WITHOUT CONTRAST TECHNIQUE: Contiguous axial images were obtained from the base  of the skull through the vertex without intravenous contrast. RADIATION DOSE REDUCTION: This exam was performed according to the departmental dose-optimization program which includes automated exposure control, adjustment of the mA and/or kV according to patient size and/or use of iterative reconstruction technique. COMPARISON:   None Available. FINDINGS: Brain: Head tilted in the scanner which limits detailed assessment. No intracranial hemorrhage. There is a questionable area of gray-white differentiation loss in the left temporal lobe, series 3, image 16 and series 5, image 25. Gray-white differentiation is otherwise preserved. No subdural or extra-axial collection. No hydrocephalus. Vascular: Atherosclerosis of skullbase vasculature without hyperdense vessel or abnormal calcification. Skull: No fracture or focal lesion. Sinuses/Orbits: Mucosal thickening throughout the paranasal sinuses may be related to intubation. No mastoid effusion. Other: None. IMPRESSION: 1. Questionable area of gray-white differentiation loss in the left temporal lobe, may be artifactual due to head tilt, however possibility of acute ischemia is also considered. Consider further evaluation with MRI. 2. No intracranial hemorrhage. Electronically Signed   By: Keith Rake M.D.   On: 03/12/2022 19:51   DG Chest Portable 1 View  Result Date: 03/11/2022 CLINICAL DATA:  cardiac arrest EXAM: PORTABLE CHEST 1 VIEW COMPARISON:  Chest x-ray 03/22/2014, chest x-ray 12/31/2006 FINDINGS: Endotracheal tube with tip approximately 2 cm above the carina. Enteric tube coursing along the thoracic spine mid line with tip in the region of the expected distal esophagus and side port along overlying the expected region of the midesophagus. Cardiac paddles overlie the patient. The heart and mediastinal contours are unchanged. Prominent hilar vasculature. No focal consolidation. Increased interstitial markings. No pleural effusion. No pneumothorax. No acute osseous abnormality-Limited evaluation due to overlapping osseous structures and overlying soft tissues. IMPRESSION: 1. Mild pulmonary edema. 2. Enteric tube in esophagus. Recommend advancing enteric tube by 12 cm. Please follow-up with repeat chest x-ray. 3. Endotracheal tube 2 cm above the carina. Electronically Signed   By:  Iven Finn M.D.   On: 02/28/2022 19:37    Microbiology Recent Results (from the past 240 hour(s))  Culture, blood (routine x 2)     Status: None (Preliminary result)   Collection Time: 02/26/2022  9:10 PM   Specimen: BLOOD LEFT HAND  Result Value Ref Range Status   Specimen Description BLOOD LEFT HAND  Final   Special Requests   Final    BOTTLES DRAWN AEROBIC AND ANAEROBIC Blood Culture results may not be optimal due to an inadequate volume of blood received in culture bottles   Culture   Final    NO GROWTH 4 DAYS Performed at [facility], [place] [address] [place], [place] [address]    Report Status PENDING  Incomplete  Culture, blood (routine x 2)     Status: None (Preliminary result)   Collection Time: 03/07/22  2:30 AM   Specimen: BLOOD LEFT HAND  Result Value Ref Range Status   Specimen Description BLOOD LEFT HAND  Final   Special Requests   Final    BOTTLES DRAWN AEROBIC ONLY Blood Culture adequate volume   Culture   Final    NO GROWTH 3 DAYS Performed at [facility], [place] [address] [place], [place] [address]    Report Status PENDING  Incomplete  Culture, Respiratory w Gram Stain     Status: None   Collection Time: 03/07/22  8:46 AM   Specimen: Tracheal Aspirate; Respiratory  Result Value Ref Range Status   Specimen Description TRACHEAL ASPIRATE  Final   Special Requests NONE  Final   Gram Stain   Final    RARE WBC PRESENT, PREDOMINANTLY MONONUCLEAR NO ORGANISMS SEEN    Culture   Final    RARE CITROBACTER FREUNDII FEW CANDIDA ALBICANS NO STAPHYLOCOCCUS AUREUS ISOLATED No Pseudomonas species isolated Performed at [facility], [address], [place], [place] [address]    Report Status 12-Mar-2022 FINAL  Final   Organism ID, Bacteria CITROBACTER FREUNDII  Final      Susceptibility   Citrobacter freundii - MIC*    CEFAZOLIN >=64 RESISTANT Resistant     CEFEPIME <=0.12 SENSITIVE Sensitive     CEFTAZIDIME <=1 SENSITIVE Sensitive      CEFTRIAXONE <=0.25 SENSITIVE Sensitive     CIPROFLOXACIN <=0.25 SENSITIVE Sensitive     GENTAMICIN <=1 SENSITIVE Sensitive     IMIPENEM 1 SENSITIVE Sensitive     TRIMETH/SULFA <=20 SENSITIVE Sensitive     PIP/TAZO <=4 SENSITIVE Sensitive     * RARE CITROBACTER FREUNDII  Culture, Respiratory w Gram Stain     Status: None (Preliminary result)   Collection Time: 03/09/22 11:49 AM   Specimen: Tracheal Aspirate; Respiratory  Result Value Ref Range Status   Specimen Description TRACHEAL ASPIRATE  Final   Special Requests NONE  Final   Gram Stain   Final    FEW WBC PRESENT, PREDOMINANTLY PMN RARE GRAM NEGATIVE RODS RARE GRAM POSITIVE RODS RARE BUDDING YEAST SEEN    Culture   Final    MODERATE GRAM NEGATIVE RODS IDENTIFICATION AND SUSCEPTIBILITIES TO FOLLOW Performed at [facility], [place] [address], [place], [place] [address]    Report Status PENDING  Incomplete  SARS Coronavirus 2 by RT PCR (hospital order, performed in [facility] hospital lab) *cepheid single result test* Anterior Nasal Swab     Status: None   Collection Time: 03/09/22 11:49 AM   Specimen: Anterior Nasal Swab  Result Value Ref Range Status   SARS Coronavirus 2 by RT PCR NEGATIVE NEGATIVE Final    Comment: (NOTE) SARS-CoV-2 target nucleic acids are NOT DETECTED.  The SARS-CoV-2 RNA is generally detectable in upper and lower respiratory specimens during the acute phase of infection. The lowest concentration of SARS-CoV-2 viral copies this assay can detect is 250 copies / mL. A negative result does not preclude SARS-CoV-2 infection and should not be used as the sole basis for treatment or other patient management decisions.  A negative result may occur with improper specimen collection / handling, submission of specimen other than nasopharyngeal swab, presence of viral mutation(s) within the areas targeted by this assay, and inadequate number of viral copies (<250 copies / mL). A negative result must be  combined with clinical observations, patient history, and epidemiological information.  Fact Sheet for Patients:   https://www.patel.info/  Fact Sheet for Healthcare Providers: https://hall.com/  This test is not yet approved or  cleared by the [place] FDA and has been authorized for detection and/or diagnosis of SARS-CoV-2 by FDA under an Emergency Use Authorization (EUA).  This EUA will remain in effect (meaning this test can be used) for the duration of the COVID-19 declaration under Section 564(b)(1) of the Act, 21 U.S.C. section 360bbb-3(b)(1), unless the authorization is terminated or revoked sooner.  Performed at [facility], [place] [address], [place], [place] [address]   Culture, blood (Routine X 2) w Reflex to ID Panel     Status: None (Preliminary result)   Collection Time: 03/09/22 12:53 PM   Specimen: BLOOD  Result Value Ref  Range Status   Specimen Description BLOOD THUMB LEFT THUMB  Final   Special Requests IN PEDIATRIC BOTTLE Blood Culture adequate volume  Final   Culture   Final    NO GROWTH < 24 HOURS Performed at [facility], [place] [address] [place], [place] [address]    Report Status PENDING  Incomplete  Culture, blood (Routine X 2) w Reflex to ID Panel     Status: None (Preliminary result)   Collection Time: 03/09/22  1:08 PM   Specimen: BLOOD LEFT WRIST  Result Value Ref Range Status   Specimen Description BLOOD LEFT WRIST  Final   Special Requests   Final    BOTTLES DRAWN AEROBIC AND ANAEROBIC Blood Culture adequate volume   Culture   Final    NO GROWTH < 24 HOURS Performed at [facility], [place] [address] [place], [place] [address]    Report Status PENDING  Incomplete    Lab Basic Metabolic Panel: Recent Labs  Lab 03/07/22 0214 03/07/22 0328 03/03/2022 0359 03/12/2022 1616 03/09/22 1550 03/09/22 1947 03/09/22 2010 04/02/22 0004 April 02, 2022 0019 04/02/22 0534 04-02-22 0621  04-02-2022 1210 Apr 02, 2022 1221  NA 149*   < > 141   < > 141 145   < > 148* 146* 150* 153* 154* 155*  K 2.7*   < > 3.2*   < > 3.3* 3.2*   < > 3.4* 3.3* 3.4* 3.4* 3.4* 3.4*  CL 111   < > 98   < > 105 105  --  110  --  114*  --  116*  --   CO2 29   < > 25   < > 21* 22  --  24  --  24  --  25  --   GLUCOSE 253*   < > 150*   < > 343* 260*  --  205*  --  182*  --  172*  --   BUN 20   < > 13   < > 32* 32*  --  29*  --  27*  --  26*  --   CREATININE 1.72*   < > 1.20   < > 3.13* 2.83*  --  2.56*  --  2.30*  --  2.17*  --   CALCIUM 8.0*   < > 8.4*   < > 8.2* 9.2  --  9.1  --  9.1  --  9.1  --   MG 1.6*  --  1.8  --   --   --   --   --   --   --   --   --   --   PHOS 4.1  --  3.8  --   --   --   --   --   --   --   --   --   --    < > = values in this interval not displayed.   Liver Function Tests: Recent Labs  Lab 03/09/22 1550 03/09/22 1947 Apr 02, 2022 0004 04-02-2022 0534 2022-04-02 1210  AST 34 32 33 36 38  ALT 70* 63* 62* 59* 57*  ALKPHOS 92 91 98 96 96  BILITOT 1.4* 1.9* 2.4* 2.6* 2.8*  PROT 4.9* 5.3* 5.6* 5.5* 5.4*  ALBUMIN 2.0* 2.6* 2.8* 2.6* 2.4*   No results for input(s): "LIPASE", "AMYLASE" in the last 168 hours. No results for input(s): "AMMONIA" in the last 168 hours. CBC: Recent Labs  Lab 02/17/2022 1856 02/27/2022 1857  03/07/22 0214 03/07/22 0328 03/14/2022 0359 03/05/2022 1616 03/09/22 1149 03/09/22 1207 03/09/22 1947 03/09/22 2010 2022/03/28 0004 03-28-22 0019 03/28/22 0621 2022/03/28 1221  WBC 17.4*   < > 4.5  --  13.9*  --  11.0*  --  10.1  --  11.6*  --   --   --   NEUTROABS 10.0*  --   --   --   --   --  9.7*  --   --   --   --   --   --   --   HGB 12.3*   < > 13.6   < > 14.3   < > 12.5*   < > 11.2* 13.6 11.2* 11.2* 10.5* 10.9*  HCT 38.8*   < > 38.2*   < > 40.2   < > 37.3*   < > 31.7* 40.0 31.8* 33.0* 31.0* 32.0*  MCV 95.8   < > 84.5  --  83.9  --  89.0  --  87.1  --  85.9  --   --   --   PLT 178   < > 205  --  115*  --  103*  --  69*  --  68*  --   --   --    < > = values  in this interval not displayed.   Cardiac Enzymes: Recent Labs  Lab 03/09/22 1149  CKTOTAL 56  CKMB 8.2*   Sepsis Labs: Recent Labs  Lab 03/05/2022 0359 03/09/22 1149 03/09/22 1947 March 28, 2022 0004 03-28-22 0534 Mar 28, 2022 1210  PROCALCITON  --  16.55  --   --   --   --   WBC 13.9* 11.0* 10.1 11.6*  --   --   LATICACIDVEN  --  3.8* 4.4* 3.4* 3.1* 2.7*    Procedures/Operations     Arabella Revelle 03/28/22, 4:49 PM

## 2022-03-16 NOTE — Progress Notes (Addendum)
Ensign Progress Note Patient Name: Lance Crosby DOB: [birth year] MRN: [id number]   Date of Service  02/14/2022  HPI/Events of Note  ABG on 70%/PRVC 32/TV 560/P 12 = 7.47/38/79/28.4. RT has increase FiO2 to 80%.  eICU Interventions  Plan: D/C NaHCO3 IV infusion.      Intervention Category Major Interventions: Acid-Base disturbance - evaluation and management;Respiratory failure - evaluation and management  Lysle Dingwall 02/26/2022, 3:58 AM

## 2022-03-16 DEATH — deceased

## 2022-03-28 LAB — SURGICAL PATHOLOGY
